# Patient Record
Sex: Male | Born: 1955 | Race: White | Hispanic: No | Marital: Married | Smoking: Former smoker
Health system: Southern US, Community
[De-identification: ages and names within clinical notes are randomized; demographics above are authoritative.]

## PROBLEM LIST (undated history)

## (undated) DIAGNOSIS — I10 Essential (primary) hypertension: Secondary | ICD-10-CM

## (undated) DIAGNOSIS — J189 Pneumonia, unspecified organism: Secondary | ICD-10-CM

## (undated) DIAGNOSIS — Z9289 Personal history of other medical treatment: Secondary | ICD-10-CM

## (undated) DIAGNOSIS — G4733 Obstructive sleep apnea (adult) (pediatric): Secondary | ICD-10-CM

## (undated) DIAGNOSIS — R7303 Prediabetes: Secondary | ICD-10-CM

## (undated) DIAGNOSIS — Z9989 Dependence on other enabling machines and devices: Secondary | ICD-10-CM

## (undated) DIAGNOSIS — I219 Acute myocardial infarction, unspecified: Secondary | ICD-10-CM

## (undated) DIAGNOSIS — J45909 Unspecified asthma, uncomplicated: Secondary | ICD-10-CM

## (undated) DIAGNOSIS — M1990 Unspecified osteoarthritis, unspecified site: Secondary | ICD-10-CM

## (undated) DIAGNOSIS — K219 Gastro-esophageal reflux disease without esophagitis: Secondary | ICD-10-CM

## (undated) HISTORY — DX: Obstructive sleep apnea (adult) (pediatric): G47.33

## (undated) HISTORY — PX: WISDOM TOOTH EXTRACTION: SHX21

## (undated) HISTORY — PX: RECONSTRUCTION URETHROPLASTY: SHX2302

## (undated) HISTORY — DX: Essential (primary) hypertension: I10

## (undated) HISTORY — PX: SHOULDER ARTHROSCOPY W/ ROTATOR CUFF REPAIR: SHX2400

## (undated) HISTORY — PX: NASAL SINUS SURGERY: SHX719

## (undated) HISTORY — PX: TONSILLECTOMY: SUR1361

## (undated) HISTORY — PX: JOINT REPLACEMENT: SHX530

---

## 1986-02-15 DIAGNOSIS — Z9289 Personal history of other medical treatment: Secondary | ICD-10-CM

## 1986-02-15 HISTORY — PX: SPLENECTOMY: SUR1306

## 1986-02-15 HISTORY — DX: Personal history of other medical treatment: Z92.89

## 1997-07-17 ENCOUNTER — Other Ambulatory Visit: Admission: RE | Admit: 1997-07-17 | Discharge: 1997-07-17 | Payer: Self-pay | Admitting: Otolaryngology

## 1998-11-18 ENCOUNTER — Ambulatory Visit (HOSPITAL_COMMUNITY): Admission: RE | Admit: 1998-11-18 | Discharge: 1998-11-18 | Payer: Self-pay | Admitting: Internal Medicine

## 1998-11-18 ENCOUNTER — Encounter: Payer: Self-pay | Admitting: Internal Medicine

## 2000-04-01 ENCOUNTER — Ambulatory Visit (HOSPITAL_BASED_OUTPATIENT_CLINIC_OR_DEPARTMENT_OTHER): Admission: RE | Admit: 2000-04-01 | Discharge: 2000-04-01 | Payer: Self-pay | Admitting: Surgery

## 2001-08-11 ENCOUNTER — Encounter: Admission: RE | Admit: 2001-08-11 | Discharge: 2001-08-11 | Payer: Self-pay | Admitting: Internal Medicine

## 2001-08-11 ENCOUNTER — Encounter: Payer: Self-pay | Admitting: Internal Medicine

## 2001-09-26 ENCOUNTER — Ambulatory Visit (HOSPITAL_COMMUNITY): Admission: RE | Admit: 2001-09-26 | Discharge: 2001-09-26 | Payer: Self-pay | Admitting: *Deleted

## 2010-04-07 ENCOUNTER — Other Ambulatory Visit: Payer: Self-pay | Admitting: Otolaryngology

## 2010-04-07 ENCOUNTER — Other Ambulatory Visit (HOSPITAL_COMMUNITY)
Admission: RE | Admit: 2010-04-07 | Discharge: 2010-04-07 | Disposition: A | Discharge status: Home / Self Care | Payer: BC Managed Care – PPO | Source: Ambulatory Visit | Attending: Otolaryngology | Admitting: Otolaryngology

## 2010-04-07 DIAGNOSIS — R220 Localized swelling, mass and lump, head: Secondary | ICD-10-CM | POA: Insufficient documentation

## 2010-04-24 ENCOUNTER — Other Ambulatory Visit (HOSPITAL_COMMUNITY)
Admission: RE | Admit: 2010-04-24 | Discharge: 2010-04-24 | Disposition: A | Discharge status: Home / Self Care | Payer: BC Managed Care – PPO | Source: Ambulatory Visit | Attending: Otolaryngology | Admitting: Otolaryngology

## 2010-04-24 DIAGNOSIS — R221 Localized swelling, mass and lump, neck: Secondary | ICD-10-CM | POA: Insufficient documentation

## 2010-04-24 DIAGNOSIS — R220 Localized swelling, mass and lump, head: Secondary | ICD-10-CM | POA: Insufficient documentation

## 2011-12-03 ENCOUNTER — Other Ambulatory Visit: Payer: Self-pay | Admitting: Family Medicine

## 2011-12-03 DIAGNOSIS — R222 Localized swelling, mass and lump, trunk: Secondary | ICD-10-CM

## 2011-12-07 ENCOUNTER — Other Ambulatory Visit: Payer: BC Managed Care – PPO

## 2011-12-13 ENCOUNTER — Ambulatory Visit
Admission: RE | Admit: 2011-12-13 | Discharge: 2011-12-13 | Disposition: A | Discharge status: Home / Self Care | Payer: BC Managed Care – PPO | Source: Ambulatory Visit | Attending: Family Medicine | Admitting: Family Medicine

## 2011-12-13 DIAGNOSIS — R222 Localized swelling, mass and lump, trunk: Secondary | ICD-10-CM

## 2011-12-13 MED ORDER — IOHEXOL 300 MG/ML  SOLN
75.0000 mL | Freq: Once | INTRAMUSCULAR | Status: AC | PRN
  Administered 2011-12-13: 75 mL via INTRAVENOUS

## 2014-12-13 ENCOUNTER — Other Ambulatory Visit: Payer: Self-pay | Admitting: Nurse Practitioner

## 2014-12-13 ENCOUNTER — Ambulatory Visit
Admission: RE | Admit: 2014-12-13 | Discharge: 2014-12-13 | Disposition: A | Discharge status: Home / Self Care | Payer: BLUE CROSS/BLUE SHIELD | Source: Ambulatory Visit | Attending: Nurse Practitioner | Admitting: Nurse Practitioner

## 2014-12-13 DIAGNOSIS — M5410 Radiculopathy, site unspecified: Secondary | ICD-10-CM

## 2014-12-13 DIAGNOSIS — M542 Cervicalgia: Secondary | ICD-10-CM

## 2015-02-16 DIAGNOSIS — I219 Acute myocardial infarction, unspecified: Secondary | ICD-10-CM | POA: Insufficient documentation

## 2015-02-16 HISTORY — DX: Acute myocardial infarction, unspecified: I21.9

## 2015-07-17 HISTORY — PX: TOTAL HIP ARTHROPLASTY: SHX124

## 2015-07-29 ENCOUNTER — Emergency Department (HOSPITAL_COMMUNITY): Payer: BLUE CROSS/BLUE SHIELD

## 2015-07-29 ENCOUNTER — Other Ambulatory Visit: Payer: Self-pay

## 2015-07-29 ENCOUNTER — Encounter (HOSPITAL_COMMUNITY): Payer: Self-pay | Admitting: Nurse Practitioner

## 2015-07-29 ENCOUNTER — Emergency Department (HOSPITAL_COMMUNITY)
Admission: EM | Admit: 2015-07-29 | Discharge: 2015-07-29 | Disposition: A | Discharge status: Home / Self Care | Payer: BLUE CROSS/BLUE SHIELD | Attending: Emergency Medicine | Admitting: Emergency Medicine

## 2015-07-29 DIAGNOSIS — Z792 Long term (current) use of antibiotics: Secondary | ICD-10-CM | POA: Insufficient documentation

## 2015-07-29 DIAGNOSIS — Z79891 Long term (current) use of opiate analgesic: Secondary | ICD-10-CM | POA: Diagnosis not present

## 2015-07-29 DIAGNOSIS — K859 Acute pancreatitis without necrosis or infection, unspecified: Secondary | ICD-10-CM | POA: Diagnosis not present

## 2015-07-29 DIAGNOSIS — R109 Unspecified abdominal pain: Secondary | ICD-10-CM | POA: Diagnosis not present

## 2015-07-29 DIAGNOSIS — R079 Chest pain, unspecified: Secondary | ICD-10-CM | POA: Diagnosis not present

## 2015-07-29 DIAGNOSIS — Z79899 Other long term (current) drug therapy: Secondary | ICD-10-CM | POA: Diagnosis not present

## 2015-07-29 DIAGNOSIS — K760 Fatty (change of) liver, not elsewhere classified: Secondary | ICD-10-CM | POA: Diagnosis not present

## 2015-07-29 DIAGNOSIS — I10 Essential (primary) hypertension: Secondary | ICD-10-CM | POA: Diagnosis not present

## 2015-07-29 DIAGNOSIS — R0789 Other chest pain: Secondary | ICD-10-CM | POA: Diagnosis not present

## 2015-07-29 HISTORY — DX: Gastro-esophageal reflux disease without esophagitis: K21.9

## 2015-07-29 HISTORY — DX: Essential (primary) hypertension: I10

## 2015-07-29 LAB — HEPATIC FUNCTION PANEL
ALK PHOS: 65 U/L (ref 38–126)
ALT: 63 U/L (ref 17–63)
AST: 56 U/L — AB (ref 15–41)
Albumin: 4.4 g/dL (ref 3.5–5.0)
Bilirubin, Direct: 0.3 mg/dL (ref 0.1–0.5)
Indirect Bilirubin: 1.1 mg/dL — ABNORMAL HIGH (ref 0.3–0.9)
TOTAL PROTEIN: 7.8 g/dL (ref 6.5–8.1)
Total Bilirubin: 1.4 mg/dL — ABNORMAL HIGH (ref 0.3–1.2)

## 2015-07-29 LAB — BASIC METABOLIC PANEL
ANION GAP: 11 (ref 5–15)
BUN: 12 mg/dL (ref 6–20)
CHLORIDE: 102 mmol/L (ref 101–111)
CO2: 24 mmol/L (ref 22–32)
Calcium: 10 mg/dL (ref 8.9–10.3)
Creatinine, Ser: 1.12 mg/dL (ref 0.61–1.24)
GFR calc Af Amer: >60 mL/min (ref >60)
GLUCOSE: 120 mg/dL — AB (ref 65–99)
POTASSIUM: 3.9 mmol/L (ref 3.5–5.1)
Sodium: 137 mmol/L (ref 135–145)

## 2015-07-29 LAB — I-STAT TROPONIN, ED: Troponin i, poc: 0 ng/mL (ref 0.00–0.08)

## 2015-07-29 LAB — LIPASE, BLOOD: Lipase: 64 U/L — ABNORMAL HIGH (ref 11–51)

## 2015-07-29 LAB — CBC
HEMATOCRIT: 50.1 % (ref 39.0–52.0)
HEMOGLOBIN: 17.4 g/dL — AB (ref 13.0–17.0)
MCH: 33.9 pg (ref 26.0–34.0)
MCHC: 34.7 g/dL (ref 30.0–36.0)
MCV: 97.5 fL (ref 78.0–100.0)
Platelets: 385 10*3/uL (ref 150–400)
RBC: 5.14 MIL/uL (ref 4.22–5.81)
RDW: 14.6 % (ref 11.5–15.5)
WBC: 8.7 10*3/uL (ref 4.0–10.5)

## 2015-07-29 MED ORDER — SODIUM CHLORIDE 0.9 % IV BOLUS (SEPSIS)
1000.0000 mL | Freq: Once | INTRAVENOUS | Status: AC
  Administered 2015-07-29: 1000 mL via INTRAVENOUS

## 2015-07-29 MED ORDER — IOPAMIDOL (ISOVUE-370) INJECTION 76%
INTRAVENOUS | Status: AC
  Administered 2015-07-29: 100 mL
  Filled 2015-07-29: qty 100

## 2015-07-29 MED ORDER — OXYCODONE-ACETAMINOPHEN 5-325 MG PO TABS: 1.0000 | ORAL_TABLET | Freq: Four times a day (QID) | ORAL | Status: DC | PRN

## 2015-07-29 MED ORDER — ONDANSETRON 8 MG PO TBDP: 8.0000 mg | ORAL_TABLET | Freq: Three times a day (TID) | ORAL | Status: DC | PRN

## 2015-07-29 MED ORDER — OXYCODONE-ACETAMINOPHEN 5-325 MG PO TABS
1.0000 | ORAL_TABLET | Freq: Once | ORAL | Status: AC
  Administered 2015-07-29: 1 via ORAL
  Filled 2015-07-29: qty 1

## 2015-07-29 MED ORDER — FENTANYL CITRATE (PF) 100 MCG/2ML IJ SOLN
100.0000 ug | Freq: Once | INTRAMUSCULAR | Status: AC
  Administered 2015-07-29: 100 ug via INTRAVENOUS
  Filled 2015-07-29: qty 2

## 2015-07-29 MED ORDER — ONDANSETRON HCL 4 MG/2ML IJ SOLN
4.0000 mg | Freq: Once | INTRAMUSCULAR | Status: AC
Start: 2015-07-29 — End: 2015-07-29
  Administered 2015-07-29: 4 mg via INTRAVENOUS
  Filled 2015-07-29: qty 2

## 2015-07-29 NOTE — ED Notes (Signed)
Patient transported to CT 

## 2015-07-29 NOTE — ED Provider Notes (Signed)
CSN: [id number]     Arrival date & time 07/29/15  1400 History   First MD Initiated Contact with Patient 07/29/15 1529     Chief Complaint  Patient presents with  . Chest Pain      Patient is a 60 y.o. male presenting with chest pain. The history is provided by the patient.  Chest Pain Pain location:  L chest Associated symptoms: abdominal pain, nausea and vomiting   Associated symptoms: no cough   Patient presented with pain in his left chest left abdomen that started a few hours ago. Has had nausea with some vomiting over the last hour. States he's felt bad. Pain is been in his chest and abdomen. Was worse with breathing. Reportedly a few weeks ago was coughing up blood. Never saw Dr. for it. Previous history of neck cancer. 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 No diarrhea. Was reportedly diaphoretic. No fevers. No chills. No swelling in his legs. 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  Past Medical History  Diagnosis Date  . Hypertension   . GERD (gastroesophageal reflux disease)    Past Surgical History  Procedure Laterality Date  . Splenectomy     No family history on file. Social History  Substance Use Topics  . Smoking status: Never Smoker   . Smokeless tobacco: None  . Alcohol Use: No    Review of Systems  Constitutional: Negative for appetite change.  HENT: Negative for sore throat.   Respiratory: Negative for cough.   Cardiovascular: Positive for chest pain.  Gastrointestinal: Positive for nausea, vomiting and abdominal pain.  Genitourinary: Negative for flank pain.  Musculoskeletal: Negative for joint swelling.  Skin: Negative for wound.  Neurological: Positive for light-headedness. Negative for speech difficulty.  Hematological: Negative for adenopathy.  Psychiatric/Behavioral: Negative for dysphoric mood.      Allergies  Codeine  Home Medications   Prior to Admission medications   Medication Sig Start Date  End Date Taking? Authorizing Provider  esomeprazole (NEXIUM) 40 MG capsule Take 40 mg by mouth daily at 12 noon.   Yes Historical Provider, MD  losartan-hydrochlorothiazide (HYZAAR) 100-12.5 MG tablet Take 1 tablet by mouth daily. for blood pressure 06/03/15  Yes Historical Provider, MD  amoxicillin-clavulanate (AUGMENTIN) 500-125 MG tablet Take 1 tablet by mouth 2 (two) times daily. 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    Historical Provider, MD  ondansetron (ZOFRAN-ODT) 8 MG disintegrating tablet Take 1 tablet (8 mg total) by mouth every 8 (eight) hours as needed for nausea or vomiting. 07/29/15   Davonna Belling, MD  oxyCODONE-acetaminophen (PERCOCET/ROXICET) 5-325 MG tablet Take 1-2 tablets by mouth every 6 (six) hours as needed for severe pain. 07/29/15   Davonna Belling, MD   BP 160/97 mmHg  Pulse 76  Temp(Src) 98.1 F (36.7 C) (Oral)  Resp 21  Ht 6' (1.829 m)  Wt 224 lb (101.606 kg)  BMI 30.37 kg/m2  SpO2 94% Physical Exam  Constitutional: He appears well-developed.  Patient appears uncomfortable  HENT:  Head: Atraumatic.  Eyes: EOM are normal.  Neck: Neck supple.  Cardiovascular: Normal rate.   Pulmonary/Chest: Effort normal. No respiratory distress. He has no wheezes. He has no rales.  Abdominal: There is tenderness.  Moderate tenderness to left upper quadrant.  Musculoskeletal: Normal range of motion. He exhibits no edema.  Neurological: He is alert.  Skin: Skin is warm.  ED Course  Procedures (including critical care time) Labs Review Labs Reviewed  BASIC METABOLIC PANEL - Abnormal; Notable for the following:    Glucose, Bld 120 (*)    All other components within normal limits  CBC - Abnormal; Notable for the following:    Hemoglobin 17.4 (*)    All other components within normal limits  HEPATIC FUNCTION PANEL - Abnormal; Notable for the following:    AST 56 (*)    Total Bilirubin 1.4 (*)     Indirect Bilirubin 1.1 (*)    All other components within normal limits  LIPASE, BLOOD - Abnormal; Notable for the following:    Lipase 64 (*)    All other components within normal limits  I-STAT TROPOININ, ED    Imaging Review Dg Chest 2 View  07/29/2015  CLINICAL DATA:  Chest pain for 1 day EXAM: CHEST  2 VIEW COMPARISON:  Chest CT December 13, 2011 FINDINGS: There is patchy infiltrate in the left base. There is slight scarring in the right base. Lungs elsewhere clear. Heart size and pulmonary vascularity are normal. No adenopathy. No bone lesions. IMPRESSION: Patchy infiltrate left base consistent with pneumonia. Mild scarring right base. Lungs elsewhere clear. Cardiac silhouette within normal limits. Followup PA and lateral chest radiographs recommended in 3-4 weeks following trial of antibiotic therapy to ensure resolution and exclude underlying malignancy. Electronically Signed   By: Lowella Grip III M.D.   On: 07/29/2015 14:37   Ct Angio Chest Pe W/cm &/or Wo Cm  07/29/2015  CLINICAL DATA:  Left-sided chest pain. Left upper quadrant tenderness. EXAM: CT ANGIOGRAPHY CHEST CT ABDOMEN AND PELVIS WITH CONTRAST TECHNIQUE: Multidetector CT imaging of the chest was performed using the standard protocol during bolus administration of intravenous contrast. Multiplanar CT image reconstructions and MIPs were obtained to evaluate the vascular anatomy. Multidetector CT imaging of the abdomen and pelvis was performed using the standard protocol during bolus administration of intravenous contrast. CONTRAST:  100 mL Isovue 370 COMPARISON:  Chest radiograph 07/29/2015 FINDINGS: CTA CHEST FINDINGS Mediastinum/Lymph nodes: Chest CTA has some limitation due to motion artifact. There is no evidence for large or central pulmonary embolism. Limited evaluation of the aortic root due to extensive artifact. The great vessels are patent with a bovine type arch. There is no significant chest lymphadenopathy. No large  pericardial effusion. No significant axillary lymphadenopathy. Lungs/Pleura: Trachea and mainstem bronchi are patent. Patchy densities at the left lung base and the lingula are nonspecific but could represent atelectasis. Patchy densities along the right lower lobe are also suggestive for volume loss. Musculoskeletal: No acute bone abnormality. CT ABDOMEN and PELVIS FINDINGS Hepatobiliary: Low-attenuation of the liver is suggestive for hepatic steatosis. No focal liver lesion. No gross abnormality to the gallbladder. The portal venous system is patent. Pancreas: There is edema and fat stranding in the region of the pancreatic neck and body. There is no significant pancreatic duct dilatation. 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 There are at least 2 small splenules in the left upper quadrant of the abdomen. Adrenals/Urinary tract: Normal appearance of the adrenal glands. Cyst in the right kidney interpolar region. Probable small cyst in the medial aspect of left kidney. No hydronephrosis. Normal appearance of the urinary bladder. Stomach/Bowel: No acute abnormality to the stomach or duodenum. Scattered colonic diverticula but acute colonic inflammatory changes. Normal appearance of the appendix. Vascular/Lymphatic: Negative for an aortic aneurysm. No suspicious lymphadenopathy in the abdomen or pelvis. Reproductive: Mild fat stranding in the pelvis near the seminal  vesicles is nonspecific. No gross abnormality to the prostate. Other: No significant free fluid.  No free air. Musculoskeletal: No acute bone abnormality. Mild disc space loss at L5-S1. Review of the MIP images confirms the above findings. IMPRESSION: CTA examination of the chest has technical limitations due to motion artifact. There is no evidence for a large or central pulmonary embolism. Patchy densities at the left lung base are most suggestive for atelectasis. Peripancreatic edema and fat stranding around the pancreatic neck and body. Findings are  suggestive for pancreatitis. No evidence for duct dilatation and no evidence for pseudocyst formation. Hepatic steatosis. Bilateral renal cysts. Colonic diverticula without acute inflammation. Mild stranding or edema near the seminal vesicles is nonspecific. Electronically Signed   By: Markus Daft M.D.   On: 07/29/2015 17:06   Ct Abdomen Pelvis W Contrast  07/29/2015  CLINICAL DATA:  Left-sided chest pain. Left upper quadrant tenderness. EXAM: CT ANGIOGRAPHY CHEST CT ABDOMEN AND PELVIS WITH CONTRAST TECHNIQUE: Multidetector CT imaging of the chest was performed using the standard protocol during bolus administration of intravenous contrast. Multiplanar CT image reconstructions and MIPs were obtained to evaluate the vascular anatomy. Multidetector CT imaging of the abdomen and pelvis was performed using the standard protocol during bolus administration of intravenous contrast. CONTRAST:  100 mL Isovue 370 COMPARISON:  Chest radiograph 07/29/2015 FINDINGS: CTA CHEST FINDINGS Mediastinum/Lymph nodes: Chest CTA has some limitation due to motion artifact. There is no evidence for large or central pulmonary embolism. Limited evaluation of the aortic root due to extensive artifact. The great vessels are patent with a bovine type arch. There is no significant chest lymphadenopathy. No large pericardial effusion. No significant axillary lymphadenopathy. Lungs/Pleura: Trachea and mainstem bronchi are patent. Patchy densities at the left lung base and the lingula are nonspecific but could represent atelectasis. Patchy densities along the right lower lobe are also suggestive for volume loss. Musculoskeletal: No acute bone abnormality. CT ABDOMEN and PELVIS FINDINGS Hepatobiliary: Low-attenuation of the liver is suggestive for hepatic steatosis. No focal liver lesion. No gross abnormality to the gallbladder. The portal venous system is patent. Pancreas: There is edema and fat stranding in the region of the pancreatic neck and  body. There is no significant pancreatic duct dilatation. 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 There are at least 2 small splenules in the left upper quadrant of the abdomen. Adrenals/Urinary tract: Normal appearance of the adrenal glands. Cyst in the right kidney interpolar region. Probable small cyst in the medial aspect of left kidney. No hydronephrosis. Normal appearance of the urinary bladder. Stomach/Bowel: No acute abnormality to the stomach or duodenum. Scattered colonic diverticula but acute colonic inflammatory changes. Normal appearance of the appendix. Vascular/Lymphatic: Negative for an aortic aneurysm. No suspicious lymphadenopathy in the abdomen or pelvis. Reproductive: Mild fat stranding in the pelvis near the seminal vesicles is nonspecific. No gross abnormality to the prostate. Other: No significant free fluid.  No free air. Musculoskeletal: No acute bone abnormality. Mild disc space loss at L5-S1. Review of the MIP images confirms the above findings. IMPRESSION: CTA examination of the chest has technical limitations due to motion artifact. There is no evidence for a large or central pulmonary embolism. Patchy densities at the left lung base are most suggestive for atelectasis. Peripancreatic edema and fat stranding around the pancreatic neck and body. Findings are suggestive for pancreatitis. No evidence for duct dilatation and no evidence for pseudocyst formation. Hepatic steatosis. Bilateral renal cysts. Colonic diverticula without acute inflammation. Mild stranding or edema near the  seminal vesicles is nonspecific. Electronically Signed   By: Markus Daft M.D.   On: 07/29/2015 17:06   I have personally reviewed and evaluated these images and lab results as part of my medical decision-making.   EKG Interpretation   Date/Time:  Tuesday July 29 2015 14:02:59 EDT Ventricular Rate:  80 PR Interval:  174 QRS Duration: 94 QT Interval:  400 QTC Calculation: 461 R Axis:   57 Text Interpretation:   Normal sinus rhythm Inferior infarct , age  undetermined Cannot rule out Anterior infarct , age undetermined Abnormal  ECG Confirmed by Alvino Chapel  MD, Ovid Curd [phone]) on 07/29/2015 3:31:16 PM      MDM   Final diagnoses:  Acute pancreatitis, unspecified complication status, unspecified pancreatitis type    Patient with abdominal pain and chest pain. Pain was somewhat pleuritic but was tender in abdomen. [facility] chest did not show blood clot however the does show pancreatitis. Upon further questioning he states he did somewhat recently drink alcohol more heavily. He does have a couple glasses of wine a day. He is tolerating orals since feels as if his pain is controlled. Will discharge home to follow-up with PCP and GI as needed.    Davonna Belling, MD 07/29/15 [phone]

## 2015-07-29 NOTE — ED Notes (Signed)
Pt endorses central left sided chest pain that started at work 3-4 hours ago. Patient took tums without relief. Patient pain had increased throughout the day. Patient presenting diaphoretic clutching chest. Sts pain is 10/10 with sharp shooting pains. Patient has HTN and no other cardiac history. Patient alert and oriented x4 able to speak full sentences.

## 2015-07-29 NOTE — ED Notes (Signed)
Family at bedside. 

## 2015-07-29 NOTE — ED Notes (Signed)
Pt returned to xray sts pt was diaphoretic and holding chest. Repeat EKG completed and old and new EKG shown to Dr. Oleta Mouse. Pt and family member appearing anxious informed of EKG results and seem more calm. Pt in view of nurse first desk.

## 2015-07-29 NOTE — Discharge Instructions (Signed)

## 2015-08-06 DIAGNOSIS — Z6832 Body mass index (BMI) 32.0-32.9, adult: Secondary | ICD-10-CM | POA: Diagnosis not present

## 2015-08-06 DIAGNOSIS — Z9081 Acquired absence of spleen: Secondary | ICD-10-CM | POA: Diagnosis not present

## 2015-08-06 DIAGNOSIS — K859 Acute pancreatitis without necrosis or infection, unspecified: Secondary | ICD-10-CM | POA: Diagnosis not present

## 2015-08-06 DIAGNOSIS — Z79899 Other long term (current) drug therapy: Secondary | ICD-10-CM | POA: Diagnosis not present

## 2015-08-07 ENCOUNTER — Encounter: Payer: Self-pay | Admitting: Internal Medicine

## 2015-08-12 ENCOUNTER — Telehealth: Payer: Self-pay | Admitting: Internal Medicine

## 2015-08-13 NOTE — Telephone Encounter (Signed)
Pt having abdominal pain and nausea, has been seen in the ER for ?pancreatitis. PCP requests pt be seen sooner than scheduled appt, request asap. Pt scheduled to see Alonza Bogus PA 08/22/15@11am . Pt aware of appt.

## 2015-08-13 NOTE — Telephone Encounter (Signed)
Left message to call back  

## 2015-08-22 ENCOUNTER — Other Ambulatory Visit (INDEPENDENT_AMBULATORY_CARE_PROVIDER_SITE_OTHER): Payer: BLUE CROSS/BLUE SHIELD

## 2015-08-22 ENCOUNTER — Encounter: Payer: Self-pay | Admitting: Gastroenterology

## 2015-08-22 ENCOUNTER — Ambulatory Visit (INDEPENDENT_AMBULATORY_CARE_PROVIDER_SITE_OTHER): Payer: BLUE CROSS/BLUE SHIELD | Admitting: Gastroenterology

## 2015-08-22 VITALS — BP 140/78 | HR 76 | Ht 72.0 in | Wt 226.4 lb

## 2015-08-22 DIAGNOSIS — R1011 Right upper quadrant pain: Secondary | ICD-10-CM

## 2015-08-22 DIAGNOSIS — K859 Acute pancreatitis without necrosis or infection, unspecified: Secondary | ICD-10-CM

## 2015-08-22 HISTORY — DX: Right upper quadrant pain: R10.11

## 2015-08-22 HISTORY — DX: Acute pancreatitis without necrosis or infection, unspecified: K85.90

## 2015-08-22 LAB — HEPATIC FUNCTION PANEL
ALBUMIN: 4.5 g/dL (ref 3.5–5.2)
ALT: 30 U/L (ref 0–53)
AST: 26 U/L (ref 0–37)
Alkaline Phosphatase: 70 U/L (ref 39–117)
Bilirubin, Direct: 0.1 mg/dL (ref 0.0–0.3)
Total Bilirubin: 0.7 mg/dL (ref 0.2–1.2)
Total Protein: 7.5 g/dL (ref 6.0–8.3)

## 2015-08-22 LAB — LIPASE: Lipase: 52 U/L (ref 11.0–59.0)

## 2015-08-22 NOTE — Progress Notes (Addendum)
08/22/2015 Jose Clay [id number] [birth year]   HISTORY OF PRESENT ILLNESS:  This is a pleasant 60 year old male who is new to our office and was referred here by Cyndi Bender, PA-C. Patient was recently evaluated in the ER for epigastric/right upper quadrant abdominal pain. He was diagnosed with acute pancreatitis after being found to have a lipase of 64 and a CT scan of the abdomen and pelvis with contrast showing peripancreatic edema and fat stranding surrounding the pancreatic neck and body suggestive of pancreatitis. There was no evidence for ductal dilatation and no evidence for pseudocyst formation.  Gallbladder appeared normal. ALT and alkaline phosphatase were normal, but total bilirubin was slightly elevated at 1.4 and AST slightly elevated at 56. He presents here today for follow-up of this. He states that he is feeling much better, about 100%.  He does admit to daily alcohol use in the form of mostly wine or beer, but not in large amounts at a time. He also reports that in the past he had a history of a "spastic gallbladder" and they never took it out.    He did have a colonoscopy in 2014 by Dr. Melina Copa in [place] that was reportedly normal. We are going to obtain the records on that. He reports family history and his father and an uncle.   Past Medical History  Diagnosis Date  . Hypertension   . GERD (gastroesophageal reflux disease)    Past Surgical History  Procedure Laterality Date  . Splenectomy      reports that he has never smoked. He has never used smokeless tobacco. He reports that he does not drink alcohol or use illicit drugs. family history includes Colon cancer in his father; Diabetes in his mother. Allergies  Allergen Reactions  . Codeine Hives      Outpatient Encounter Prescriptions as of 08/22/2015  Medication Sig  . amoxicillin-clavulanate (AUGMENTIN) 500-125 MG tablet Take 1 tablet by mouth 2 (two) times daily. 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  . esomeprazole (NEXIUM) 40 MG capsule Take 40 mg by mouth daily at 12 noon.  Marland Kitchen losartan-hydrochlorothiazide (HYZAAR) 100-12.5 MG tablet Take 1 tablet by mouth daily. for blood pressure  . ondansetron (ZOFRAN-ODT) 8 MG disintegrating tablet Take 1 tablet (8 mg total) by mouth every 8 (eight) hours as needed for nausea or vomiting.  Marland Kitchen oxyCODONE-acetaminophen (PERCOCET/ROXICET) 5-325 MG tablet Take 1-2 tablets by mouth every 6 (six) hours as needed for severe pain.   No facility-administered encounter medications on file as of 08/22/2015.     REVIEW OF SYSTEMS  : All other systems reviewed and negative except where noted in the History of Present Illness.   PHYSICAL EXAM: BP 140/78 mmHg  Pulse 76  Ht 6' (1.829 m)  Wt 226 lb 6.4 oz (102.694 kg)  BMI 30.70 kg/m2  SpO2 98% General: Well developed white male in no acute distress Head: Normocephalic and atraumatic Eyes:  Sclerae anicteric, conjunctiva pink. Ears: Normal auditory acuity Lungs: Clear throughout to auscultation Heart: Regular rate and rhythm Abdomen: Soft, non-distended.  Normal bowel sounds.  Non-tender. Musculoskeletal: Symmetrical with no gross deformities  Skin: No lesions on visible extremities Extremities: No edema  Neurological: Alert oriented x 4, grossly non-focal Psychological:  Alert and cooperative. Normal mood and affect  ASSESSMENT AND PLAN: -Acute pancreatitis:  Significantly improved/resolved.  Likely alcohol-induced since he does drink some alcohol daily, but AST and total  bili both slightly elevated during this incident as well. CT scan did not indicate any gallbladder/gallstone issues, but we will schedule ultrasound for better/further evaluation. Will repeat lipase and LFTs today. Advised him to try to significantly reduce his alcohol use to prevent future recurrence.  *Will obtain previous colonoscopy records from Dr. Melina Copa in [place].   Will need a five-year recall due to family history of colon cancer.  **Colonoscopy report obtained. He had a colonoscopy in April 2014 at which time he had a single sessile 3 mm polyp found at the hepatic flexure and a single sessile 3 mm polyp found in the transverse colon. Also had multiple diverticula seen in the whole colon. Polyps were tubular adenomas.  Will enter for a 5 year colonoscopy recall.  CC:  Cyndi Bender, PA-C

## 2015-08-22 NOTE — Patient Instructions (Signed)
Please go to the basement level to have your labs drawn.  You have been scheduled for an abdominal ultrasound at [facility] (1st floor of hospital) on Tuesday 08-26-2015 at 9:30 am. Please arrive at 9:15 r to your appointment for registration. Make certain not to have anything to eat or drink after midnight. Should you need to reschedule your appointment, please contact radiology at [phone]. This test typically takes about 30 minutes to perform.

## 2015-08-22 NOTE — Progress Notes (Signed)
i agree with the above note, plan 

## 2015-08-26 ENCOUNTER — Ambulatory Visit (HOSPITAL_COMMUNITY): Payer: BLUE CROSS/BLUE SHIELD

## 2015-08-27 ENCOUNTER — Ambulatory Visit (HOSPITAL_COMMUNITY)
Admission: RE | Admit: 2015-08-27 | Discharge: 2015-08-27 | Disposition: A | Discharge status: Home / Self Care | Payer: BLUE CROSS/BLUE SHIELD | Source: Ambulatory Visit | Attending: Gastroenterology | Admitting: Gastroenterology

## 2015-08-27 DIAGNOSIS — R1011 Right upper quadrant pain: Secondary | ICD-10-CM | POA: Insufficient documentation

## 2015-08-27 DIAGNOSIS — Z9081 Acquired absence of spleen: Secondary | ICD-10-CM | POA: Diagnosis not present

## 2015-08-27 DIAGNOSIS — K859 Acute pancreatitis without necrosis or infection, unspecified: Secondary | ICD-10-CM | POA: Diagnosis not present

## 2015-08-27 DIAGNOSIS — K760 Fatty (change of) liver, not elsewhere classified: Secondary | ICD-10-CM | POA: Diagnosis not present

## 2015-08-27 DIAGNOSIS — I700 Atherosclerosis of aorta: Secondary | ICD-10-CM | POA: Diagnosis not present

## 2015-10-03 DIAGNOSIS — L237 Allergic contact dermatitis due to plants, except food: Secondary | ICD-10-CM | POA: Diagnosis not present

## 2015-10-03 DIAGNOSIS — Z6833 Body mass index (BMI) 33.0-33.9, adult: Secondary | ICD-10-CM | POA: Diagnosis not present

## 2015-10-13 ENCOUNTER — Ambulatory Visit: Payer: BLUE CROSS/BLUE SHIELD | Admitting: Internal Medicine

## 2015-11-26 DIAGNOSIS — R7303 Prediabetes: Secondary | ICD-10-CM | POA: Diagnosis not present

## 2015-11-26 DIAGNOSIS — I10 Essential (primary) hypertension: Secondary | ICD-10-CM | POA: Diagnosis not present

## 2015-11-26 DIAGNOSIS — K859 Acute pancreatitis without necrosis or infection, unspecified: Secondary | ICD-10-CM | POA: Diagnosis not present

## 2015-11-26 DIAGNOSIS — Z23 Encounter for immunization: Secondary | ICD-10-CM | POA: Diagnosis not present

## 2015-11-26 DIAGNOSIS — E7800 Pure hypercholesterolemia, unspecified: Secondary | ICD-10-CM | POA: Diagnosis not present

## 2015-11-26 DIAGNOSIS — Z9081 Acquired absence of spleen: Secondary | ICD-10-CM | POA: Diagnosis not present

## 2015-11-27 DIAGNOSIS — M5432 Sciatica, left side: Secondary | ICD-10-CM | POA: Diagnosis not present

## 2015-12-04 ENCOUNTER — Other Ambulatory Visit: Payer: Self-pay | Admitting: Physician Assistant

## 2015-12-04 ENCOUNTER — Ambulatory Visit
Admission: RE | Admit: 2015-12-04 | Discharge: 2015-12-04 | Disposition: A | Discharge status: Home / Self Care | Payer: BLUE CROSS/BLUE SHIELD | Source: Ambulatory Visit | Attending: Physician Assistant | Admitting: Physician Assistant

## 2015-12-04 DIAGNOSIS — M25552 Pain in left hip: Secondary | ICD-10-CM | POA: Diagnosis not present

## 2015-12-04 DIAGNOSIS — M545 Low back pain: Secondary | ICD-10-CM | POA: Diagnosis not present

## 2015-12-04 DIAGNOSIS — M5432 Sciatica, left side: Secondary | ICD-10-CM

## 2015-12-10 DIAGNOSIS — M5117 Intervertebral disc disorders with radiculopathy, lumbosacral region: Secondary | ICD-10-CM | POA: Diagnosis not present

## 2015-12-10 DIAGNOSIS — M5116 Intervertebral disc disorders with radiculopathy, lumbar region: Secondary | ICD-10-CM | POA: Diagnosis not present

## 2015-12-10 DIAGNOSIS — M4726 Other spondylosis with radiculopathy, lumbar region: Secondary | ICD-10-CM | POA: Diagnosis not present

## 2015-12-17 DIAGNOSIS — Z6830 Body mass index (BMI) 30.0-30.9, adult: Secondary | ICD-10-CM | POA: Diagnosis not present

## 2015-12-17 DIAGNOSIS — M5126 Other intervertebral disc displacement, lumbar region: Secondary | ICD-10-CM | POA: Diagnosis not present

## 2015-12-17 DIAGNOSIS — I10 Essential (primary) hypertension: Secondary | ICD-10-CM | POA: Diagnosis not present

## 2015-12-22 ENCOUNTER — Other Ambulatory Visit: Payer: Self-pay | Admitting: Neurosurgery

## 2015-12-22 DIAGNOSIS — M5126 Other intervertebral disc displacement, lumbar region: Secondary | ICD-10-CM

## 2015-12-26 ENCOUNTER — Ambulatory Visit
Admission: RE | Admit: 2015-12-26 | Discharge: 2015-12-26 | Disposition: A | Discharge status: Home / Self Care | Payer: BLUE CROSS/BLUE SHIELD | Source: Ambulatory Visit | Attending: Neurosurgery | Admitting: Neurosurgery

## 2015-12-26 DIAGNOSIS — M5126 Other intervertebral disc displacement, lumbar region: Secondary | ICD-10-CM | POA: Diagnosis not present

## 2015-12-26 MED ORDER — IOPAMIDOL (ISOVUE-M 200) INJECTION 41%
1.0000 mL | Freq: Once | INTRAMUSCULAR | Status: AC
  Administered 2015-12-26: 1 mL via EPIDURAL

## 2015-12-26 MED ORDER — METHYLPREDNISOLONE ACETATE 40 MG/ML INJ SUSP (RADIOLOG
120.0000 mg | Freq: Once | INTRAMUSCULAR | Status: AC
  Administered 2015-12-26: 120 mg via EPIDURAL

## 2015-12-26 NOTE — Discharge Instructions (Signed)

## 2016-02-04 DIAGNOSIS — I10 Essential (primary) hypertension: Secondary | ICD-10-CM | POA: Diagnosis not present

## 2016-02-04 DIAGNOSIS — Z6831 Body mass index (BMI) 31.0-31.9, adult: Secondary | ICD-10-CM | POA: Diagnosis not present

## 2016-02-04 DIAGNOSIS — M5126 Other intervertebral disc displacement, lumbar region: Secondary | ICD-10-CM | POA: Diagnosis not present

## 2016-02-19 DIAGNOSIS — R1313 Dysphagia, pharyngeal phase: Secondary | ICD-10-CM | POA: Diagnosis not present

## 2016-02-19 DIAGNOSIS — J310 Chronic rhinitis: Secondary | ICD-10-CM | POA: Diagnosis not present

## 2016-03-03 DIAGNOSIS — M5416 Radiculopathy, lumbar region: Secondary | ICD-10-CM | POA: Diagnosis not present

## 2016-03-03 DIAGNOSIS — Z79899 Other long term (current) drug therapy: Secondary | ICD-10-CM | POA: Diagnosis not present

## 2016-03-03 DIAGNOSIS — Z9081 Acquired absence of spleen: Secondary | ICD-10-CM | POA: Diagnosis not present

## 2016-03-03 DIAGNOSIS — Z6834 Body mass index (BMI) 34.0-34.9, adult: Secondary | ICD-10-CM | POA: Diagnosis not present

## 2016-03-25 DIAGNOSIS — R062 Wheezing: Secondary | ICD-10-CM | POA: Diagnosis not present

## 2016-03-25 DIAGNOSIS — M5416 Radiculopathy, lumbar region: Secondary | ICD-10-CM | POA: Diagnosis not present

## 2016-03-25 DIAGNOSIS — R05 Cough: Secondary | ICD-10-CM | POA: Diagnosis not present

## 2016-03-25 DIAGNOSIS — M79606 Pain in leg, unspecified: Secondary | ICD-10-CM | POA: Diagnosis not present

## 2016-03-26 DIAGNOSIS — M79606 Pain in leg, unspecified: Secondary | ICD-10-CM | POA: Diagnosis not present

## 2016-03-26 DIAGNOSIS — R600 Localized edema: Secondary | ICD-10-CM | POA: Diagnosis not present

## 2016-04-01 DIAGNOSIS — M5416 Radiculopathy, lumbar region: Secondary | ICD-10-CM | POA: Diagnosis not present

## 2016-04-01 DIAGNOSIS — Z1389 Encounter for screening for other disorder: Secondary | ICD-10-CM | POA: Diagnosis not present

## 2016-04-01 DIAGNOSIS — R062 Wheezing: Secondary | ICD-10-CM | POA: Diagnosis not present

## 2016-04-01 DIAGNOSIS — Z9081 Acquired absence of spleen: Secondary | ICD-10-CM | POA: Diagnosis not present

## 2016-04-01 DIAGNOSIS — Z23 Encounter for immunization: Secondary | ICD-10-CM | POA: Diagnosis not present

## 2016-04-01 DIAGNOSIS — R05 Cough: Secondary | ICD-10-CM | POA: Diagnosis not present

## 2016-05-04 DIAGNOSIS — H2513 Age-related nuclear cataract, bilateral: Secondary | ICD-10-CM | POA: Diagnosis not present

## 2016-05-06 DIAGNOSIS — R1031 Right lower quadrant pain: Secondary | ICD-10-CM | POA: Diagnosis not present

## 2016-05-06 DIAGNOSIS — Z6835 Body mass index (BMI) 35.0-35.9, adult: Secondary | ICD-10-CM | POA: Diagnosis not present

## 2016-05-06 DIAGNOSIS — M25551 Pain in right hip: Secondary | ICD-10-CM | POA: Diagnosis not present

## 2016-05-09 DIAGNOSIS — M25551 Pain in right hip: Secondary | ICD-10-CM | POA: Diagnosis not present

## 2016-05-26 DIAGNOSIS — J45909 Unspecified asthma, uncomplicated: Secondary | ICD-10-CM | POA: Diagnosis not present

## 2016-05-26 DIAGNOSIS — M25551 Pain in right hip: Secondary | ICD-10-CM | POA: Diagnosis not present

## 2016-05-26 DIAGNOSIS — R05 Cough: Secondary | ICD-10-CM | POA: Diagnosis not present

## 2016-05-26 DIAGNOSIS — S20219A Contusion of unspecified front wall of thorax, initial encounter: Secondary | ICD-10-CM | POA: Diagnosis not present

## 2016-05-27 ENCOUNTER — Emergency Department (HOSPITAL_COMMUNITY)
Admission: EM | Admit: 2016-05-27 | Discharge: 2016-05-27 | Disposition: A | Discharge status: Home / Self Care | Payer: BLUE CROSS/BLUE SHIELD | Attending: Emergency Medicine | Admitting: Emergency Medicine

## 2016-05-27 ENCOUNTER — Encounter (HOSPITAL_COMMUNITY): Payer: Self-pay | Admitting: Emergency Medicine

## 2016-05-27 DIAGNOSIS — R0602 Shortness of breath: Secondary | ICD-10-CM | POA: Diagnosis not present

## 2016-05-27 DIAGNOSIS — R000 Tachycardia, unspecified: Secondary | ICD-10-CM | POA: Diagnosis not present

## 2016-05-27 DIAGNOSIS — I10 Essential (primary) hypertension: Secondary | ICD-10-CM | POA: Diagnosis not present

## 2016-05-27 DIAGNOSIS — J45909 Unspecified asthma, uncomplicated: Secondary | ICD-10-CM | POA: Insufficient documentation

## 2016-05-27 DIAGNOSIS — R220 Localized swelling, mass and lump, head: Secondary | ICD-10-CM | POA: Diagnosis not present

## 2016-05-27 DIAGNOSIS — T63441A Toxic effect of venom of bees, accidental (unintentional), initial encounter: Secondary | ICD-10-CM | POA: Diagnosis not present

## 2016-05-27 MED ORDER — DEXAMETHASONE 2 MG PO TABS: ORAL_TABLET | ORAL | 0 refills | Status: DC

## 2016-05-27 MED ORDER — EPINEPHRINE 0.3 MG/0.3ML IJ SOAJ: INTRAMUSCULAR | 0 refills | Status: DC

## 2016-05-27 MED ORDER — DEXAMETHASONE SODIUM PHOSPHATE 10 MG/ML IJ SOLN
10.0000 mg | Freq: Once | INTRAMUSCULAR | Status: AC
  Administered 2016-05-27: 10 mg via INTRAVENOUS
  Filled 2016-05-27: qty 1

## 2016-05-27 MED ORDER — FAMOTIDINE IN NACL 20-0.9 MG/50ML-% IV SOLN
20.0000 mg | Freq: Once | INTRAVENOUS | Status: AC
  Administered 2016-05-27: 20 mg via INTRAVENOUS
  Filled 2016-05-27: qty 50

## 2016-05-27 MED ORDER — DIPHENHYDRAMINE HCL 25 MG PO CAPS: ORAL_CAPSULE | ORAL | Status: DC

## 2016-05-27 NOTE — ED Triage Notes (Addendum)
Patient BIB [facility] for allergic reaction to multiple bee stings. Pt stung by about 8 bees around 1830, went to bed around 2300 after taking his albuterol treatment, then woke up at 0130 with facial swelling and itching. Pt called EMS, no shortness of breath initially, but SOB slowly progressed after EMS arrived. Pt was given 50mg  benadryl in rout  as well as 0.3 of epi after pt arrived in room.

## 2016-05-27 NOTE — ED Notes (Signed)
Bed: FF63 Expected date:  Expected time:  Means of arrival:  Comments: 61 yo M/ allergic reaction

## 2016-05-27 NOTE — ED Provider Notes (Signed)
[place] DEPT Provider Note: Georgena Spurling, MD, FACEP  CSN: [id number] MRN: [id number] ARRIVAL: 05/27/16 at [place]: Jose Clay is a 61 y.o. male with a PMHx of asthma, GERD, and HTN. Pt presents to the Emergency Department c/o an allergic reaction. Pt was stung by several bees (8) around 6:30-7 pm yesterday evening. He has asthma and gave himself an albuterol treatment about 11:30 PM yesterday prior to going to bed. He subsequently had the gradual onset of shortness of breath, and itching notably of his face and feet, and facial swelling around the eyes. Symptoms are moderate. He was given 50 milligrams of Benadryl IV by EMS prior to arrival without significant change; he was not given epinephrine. He notes never having a similar reaction to a bee sting. Pt has never used epinephrine before. Pt denies nausea, vomiting, diarrhea and any pain at this time.  Past Medical History:  Diagnosis Date  . GERD (gastroesophageal reflux disease)   . Hypertension     Past Surgical History:  Procedure Laterality Date  . SPLENECTOMY      Family History  Problem Relation Age of Onset  . Colon cancer Father   . Diabetes Mother     Social History  Substance Use Topics  . Smoking status: Never Smoker  . Smokeless tobacco: Never Used  . Alcohol use No    Prior to Admission medications   Medication Sig Start Date End Date Taking? Authorizing Provider  amoxicillin-clavulanate (AUGMENTIN) 500-125 MG tablet Take 1 tablet by mouth 2 (two) times daily. 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    Historical Provider, MD  esomeprazole (NEXIUM) 40 MG capsule Take 40 mg by mouth daily at 12 noon.    Historical Provider, MD  losartan-hydrochlorothiazide (HYZAAR) 100-12.5 MG tablet Take 1 tablet by mouth daily. for blood pressure 06/03/15   Historical  Provider, MD  ondansetron (ZOFRAN-ODT) 8 MG disintegrating tablet Take 1 tablet (8 mg total) by mouth every 8 (eight) hours as needed for nausea or vomiting. 07/29/15   Davonna Belling, MD  oxyCODONE-acetaminophen (PERCOCET/ROXICET) 5-325 MG tablet Take 1-2 tablets by mouth every 6 (six) hours as needed for severe pain. 07/29/15   Davonna Belling, MD    Allergies Bee venom and Codeine   REVIEW OF SYSTEMS  Negative except as noted here or in the History of Present Illness.   PHYSICAL EXAMINATION  Initial Vital Signs Blood pressure (!) 142/83, pulse 89, temperature 98.6 F (37 C), temperature source Oral, resp. rate 18, SpO2 90 %.   Examination General: Well-developed, well-nourished male in no acute distress; appearance consistent with age of record HENT: atraumatic; no pharyngeal edema; bilateral periorbital edema   Eyes: pupils equal, round and reactive to light; extraocular muscles intact Neck: supple Heart: regular rate and rhythm Lungs: faint expiratory wheezes Abdomen: soft; nondistended; nontender; no masses or hepatosplenomegaly; bowel sounds present Extremities: No deformity; full range of motion; pulses normal Neurologic: Awake, alert and oriented; motor function intact in all extremities and symmetric; no facial droop Skin: Warm and dry Psychiatric: Normal mood and affect   RESULTS  Summary of this visit's results, reviewed by myself:   EKG Interpretation  Date/Time:    Ventricular Rate:    PR Interval:    QRS Duration:   QT Interval:    QTC Calculation:   R Axis:  Text Interpretation:        Laboratory Studies: No results found for this or any previous visit (from the past 24 hour(s)). Imaging Studies: No results found.  ED COURSE  Nursing notes and initial vitals signs, including pulse oximetry, reviewed.  Vitals:   05/27/16 0306 05/27/16 0328 05/27/16 0405 05/27/16 0439  BP: (!) 161/107 (!) 149/99 (!) 159/99 (!) 142/83  Pulse: 95 95 94 89    Resp: 13 17 12 18   Temp:  98.6 F (37 C)    TempSrc:  Oral    SpO2: 92% 91% 94% 90%   4:45 AM Lungs clear, dyspnea and itching resolved. Facial edema improved but still present. We will have the patient take Benadryl regularly for the next several days and provide him with an EpiPen.  PROCEDURES    ED DIAGNOSES     ICD-9-CM ICD-10-CM   1. Allergic reaction to bee sting 989.5 T63.441A    E905.3         Shanon Rosser, MD 05/27/16 [phone]

## 2016-06-07 DIAGNOSIS — M1611 Unilateral primary osteoarthritis, right hip: Secondary | ICD-10-CM | POA: Diagnosis not present

## 2016-06-07 DIAGNOSIS — M25551 Pain in right hip: Secondary | ICD-10-CM | POA: Diagnosis not present

## 2016-06-15 DIAGNOSIS — M25551 Pain in right hip: Secondary | ICD-10-CM | POA: Diagnosis not present

## 2016-06-25 DIAGNOSIS — M879 Osteonecrosis, unspecified: Secondary | ICD-10-CM | POA: Diagnosis not present

## 2016-06-25 DIAGNOSIS — M25551 Pain in right hip: Secondary | ICD-10-CM | POA: Diagnosis not present

## 2016-06-25 DIAGNOSIS — M87051 Idiopathic aseptic necrosis of right femur: Secondary | ICD-10-CM | POA: Diagnosis not present

## 2016-06-28 DIAGNOSIS — M87051 Idiopathic aseptic necrosis of right femur: Secondary | ICD-10-CM | POA: Diagnosis not present

## 2016-06-29 DIAGNOSIS — M25551 Pain in right hip: Secondary | ICD-10-CM | POA: Diagnosis not present

## 2016-07-06 DIAGNOSIS — M87051 Idiopathic aseptic necrosis of right femur: Secondary | ICD-10-CM | POA: Diagnosis not present

## 2016-07-06 DIAGNOSIS — I10 Essential (primary) hypertension: Secondary | ICD-10-CM | POA: Diagnosis not present

## 2016-07-06 DIAGNOSIS — R7303 Prediabetes: Secondary | ICD-10-CM | POA: Diagnosis not present

## 2016-07-06 DIAGNOSIS — Z01810 Encounter for preprocedural cardiovascular examination: Secondary | ICD-10-CM | POA: Diagnosis not present

## 2016-07-07 DIAGNOSIS — Z01818 Encounter for other preprocedural examination: Secondary | ICD-10-CM | POA: Diagnosis not present

## 2016-07-07 DIAGNOSIS — X58XXXA Exposure to other specified factors, initial encounter: Secondary | ICD-10-CM | POA: Diagnosis not present

## 2016-07-07 DIAGNOSIS — S2242XA Multiple fractures of ribs, left side, initial encounter for closed fracture: Secondary | ICD-10-CM | POA: Diagnosis not present

## 2016-07-07 DIAGNOSIS — Z79899 Other long term (current) drug therapy: Secondary | ICD-10-CM | POA: Diagnosis not present

## 2016-07-07 DIAGNOSIS — R52 Pain, unspecified: Secondary | ICD-10-CM | POA: Diagnosis not present

## 2016-07-07 DIAGNOSIS — M79609 Pain in unspecified limb: Secondary | ICD-10-CM | POA: Diagnosis not present

## 2016-07-07 DIAGNOSIS — Z01810 Encounter for preprocedural cardiovascular examination: Secondary | ICD-10-CM | POA: Diagnosis not present

## 2016-07-07 DIAGNOSIS — M87051 Idiopathic aseptic necrosis of right femur: Secondary | ICD-10-CM | POA: Diagnosis not present

## 2016-07-07 DIAGNOSIS — M25551 Pain in right hip: Secondary | ICD-10-CM | POA: Diagnosis not present

## 2016-07-09 DIAGNOSIS — Z6832 Body mass index (BMI) 32.0-32.9, adult: Secondary | ICD-10-CM | POA: Diagnosis not present

## 2016-07-09 DIAGNOSIS — I2510 Atherosclerotic heart disease of native coronary artery without angina pectoris: Secondary | ICD-10-CM | POA: Diagnosis not present

## 2016-07-09 DIAGNOSIS — I10 Essential (primary) hypertension: Secondary | ICD-10-CM | POA: Diagnosis not present

## 2016-07-09 DIAGNOSIS — M87051 Idiopathic aseptic necrosis of right femur: Secondary | ICD-10-CM | POA: Diagnosis not present

## 2016-07-29 DIAGNOSIS — M87051 Idiopathic aseptic necrosis of right femur: Secondary | ICD-10-CM | POA: Diagnosis not present

## 2016-08-05 DIAGNOSIS — M1611 Unilateral primary osteoarthritis, right hip: Secondary | ICD-10-CM | POA: Diagnosis not present

## 2016-08-05 DIAGNOSIS — M87051 Idiopathic aseptic necrosis of right femur: Secondary | ICD-10-CM | POA: Diagnosis not present

## 2016-08-05 DIAGNOSIS — I10 Essential (primary) hypertension: Secondary | ICD-10-CM | POA: Diagnosis not present

## 2016-08-05 DIAGNOSIS — Z79899 Other long term (current) drug therapy: Secondary | ICD-10-CM | POA: Diagnosis not present

## 2016-08-05 DIAGNOSIS — J309 Allergic rhinitis, unspecified: Secondary | ICD-10-CM | POA: Diagnosis not present

## 2016-08-05 DIAGNOSIS — F17210 Nicotine dependence, cigarettes, uncomplicated: Secondary | ICD-10-CM | POA: Diagnosis not present

## 2016-08-05 DIAGNOSIS — R161 Splenomegaly, not elsewhere classified: Secondary | ICD-10-CM | POA: Diagnosis not present

## 2016-08-05 DIAGNOSIS — Z96641 Presence of right artificial hip joint: Secondary | ICD-10-CM | POA: Diagnosis not present

## 2016-08-08 DIAGNOSIS — Z7982 Long term (current) use of aspirin: Secondary | ICD-10-CM | POA: Diagnosis not present

## 2016-08-08 DIAGNOSIS — I10 Essential (primary) hypertension: Secondary | ICD-10-CM | POA: Diagnosis not present

## 2016-08-08 DIAGNOSIS — Z471 Aftercare following joint replacement surgery: Secondary | ICD-10-CM | POA: Diagnosis not present

## 2016-08-08 DIAGNOSIS — Z791 Long term (current) use of non-steroidal anti-inflammatories (NSAID): Secondary | ICD-10-CM | POA: Diagnosis not present

## 2016-08-08 DIAGNOSIS — Z96641 Presence of right artificial hip joint: Secondary | ICD-10-CM | POA: Diagnosis not present

## 2016-08-08 DIAGNOSIS — Z7951 Long term (current) use of inhaled steroids: Secondary | ICD-10-CM | POA: Diagnosis not present

## 2016-08-09 DIAGNOSIS — Z9889 Other specified postprocedural states: Secondary | ICD-10-CM | POA: Diagnosis not present

## 2016-08-09 DIAGNOSIS — D3702 Neoplasm of uncertain behavior of tongue: Secondary | ICD-10-CM | POA: Diagnosis not present

## 2016-08-09 DIAGNOSIS — R490 Dysphonia: Secondary | ICD-10-CM | POA: Diagnosis not present

## 2016-08-09 DIAGNOSIS — J342 Deviated nasal septum: Secondary | ICD-10-CM | POA: Diagnosis not present

## 2016-08-10 DIAGNOSIS — I10 Essential (primary) hypertension: Secondary | ICD-10-CM | POA: Diagnosis not present

## 2016-08-10 DIAGNOSIS — Z96641 Presence of right artificial hip joint: Secondary | ICD-10-CM | POA: Diagnosis not present

## 2016-08-10 DIAGNOSIS — Z7982 Long term (current) use of aspirin: Secondary | ICD-10-CM | POA: Diagnosis not present

## 2016-08-10 DIAGNOSIS — Z791 Long term (current) use of non-steroidal anti-inflammatories (NSAID): Secondary | ICD-10-CM | POA: Diagnosis not present

## 2016-08-10 DIAGNOSIS — Z7951 Long term (current) use of inhaled steroids: Secondary | ICD-10-CM | POA: Diagnosis not present

## 2016-08-10 DIAGNOSIS — Z471 Aftercare following joint replacement surgery: Secondary | ICD-10-CM | POA: Diagnosis not present

## 2016-08-12 DIAGNOSIS — Z96641 Presence of right artificial hip joint: Secondary | ICD-10-CM | POA: Diagnosis not present

## 2016-08-12 DIAGNOSIS — I10 Essential (primary) hypertension: Secondary | ICD-10-CM | POA: Diagnosis not present

## 2016-08-12 DIAGNOSIS — Z7982 Long term (current) use of aspirin: Secondary | ICD-10-CM | POA: Diagnosis not present

## 2016-08-12 DIAGNOSIS — Z791 Long term (current) use of non-steroidal anti-inflammatories (NSAID): Secondary | ICD-10-CM | POA: Diagnosis not present

## 2016-08-12 DIAGNOSIS — Z01810 Encounter for preprocedural cardiovascular examination: Secondary | ICD-10-CM

## 2016-08-12 DIAGNOSIS — Z7951 Long term (current) use of inhaled steroids: Secondary | ICD-10-CM | POA: Diagnosis not present

## 2016-08-12 DIAGNOSIS — Z471 Aftercare following joint replacement surgery: Secondary | ICD-10-CM | POA: Diagnosis not present

## 2016-08-12 HISTORY — DX: Encounter for preprocedural cardiovascular examination: Z01.810

## 2016-08-13 DIAGNOSIS — R931 Abnormal findings on diagnostic imaging of heart and coronary circulation: Secondary | ICD-10-CM

## 2016-08-13 DIAGNOSIS — Z01810 Encounter for preprocedural cardiovascular examination: Secondary | ICD-10-CM | POA: Diagnosis not present

## 2016-08-13 HISTORY — DX: Abnormal findings on diagnostic imaging of heart and coronary circulation: R93.1

## 2016-08-16 DIAGNOSIS — Z7982 Long term (current) use of aspirin: Secondary | ICD-10-CM | POA: Diagnosis not present

## 2016-08-16 DIAGNOSIS — I10 Essential (primary) hypertension: Secondary | ICD-10-CM | POA: Diagnosis not present

## 2016-08-16 DIAGNOSIS — Z791 Long term (current) use of non-steroidal anti-inflammatories (NSAID): Secondary | ICD-10-CM | POA: Diagnosis not present

## 2016-08-16 DIAGNOSIS — C029 Malignant neoplasm of tongue, unspecified: Secondary | ICD-10-CM | POA: Diagnosis not present

## 2016-08-16 DIAGNOSIS — Z7951 Long term (current) use of inhaled steroids: Secondary | ICD-10-CM | POA: Diagnosis not present

## 2016-08-16 DIAGNOSIS — D3702 Neoplasm of uncertain behavior of tongue: Secondary | ICD-10-CM | POA: Diagnosis not present

## 2016-08-16 DIAGNOSIS — Z471 Aftercare following joint replacement surgery: Secondary | ICD-10-CM | POA: Diagnosis not present

## 2016-08-16 DIAGNOSIS — Z96641 Presence of right artificial hip joint: Secondary | ICD-10-CM | POA: Diagnosis not present

## 2016-08-17 DIAGNOSIS — Z01810 Encounter for preprocedural cardiovascular examination: Secondary | ICD-10-CM | POA: Diagnosis not present

## 2016-08-24 DIAGNOSIS — J351 Hypertrophy of tonsils: Secondary | ICD-10-CM | POA: Diagnosis not present

## 2016-08-24 DIAGNOSIS — J342 Deviated nasal septum: Secondary | ICD-10-CM | POA: Diagnosis not present

## 2016-08-24 DIAGNOSIS — R490 Dysphonia: Secondary | ICD-10-CM | POA: Diagnosis not present

## 2016-09-16 DIAGNOSIS — Z96641 Presence of right artificial hip joint: Secondary | ICD-10-CM | POA: Diagnosis not present

## 2016-09-16 DIAGNOSIS — M87051 Idiopathic aseptic necrosis of right femur: Secondary | ICD-10-CM | POA: Diagnosis not present

## 2016-09-17 DIAGNOSIS — L237 Allergic contact dermatitis due to plants, except food: Secondary | ICD-10-CM | POA: Diagnosis not present

## 2016-09-29 DIAGNOSIS — Z96641 Presence of right artificial hip joint: Secondary | ICD-10-CM | POA: Diagnosis not present

## 2016-09-29 DIAGNOSIS — S73004A Unspecified dislocation of right hip, initial encounter: Secondary | ICD-10-CM | POA: Diagnosis not present

## 2016-09-29 DIAGNOSIS — R52 Pain, unspecified: Secondary | ICD-10-CM | POA: Diagnosis not present

## 2016-09-29 DIAGNOSIS — Z471 Aftercare following joint replacement surgery: Secondary | ICD-10-CM | POA: Diagnosis not present

## 2016-09-29 DIAGNOSIS — S299XXA Unspecified injury of thorax, initial encounter: Secondary | ICD-10-CM | POA: Diagnosis not present

## 2016-09-29 DIAGNOSIS — T84020A Dislocation of internal right hip prosthesis, initial encounter: Secondary | ICD-10-CM | POA: Diagnosis not present

## 2016-09-29 DIAGNOSIS — M25551 Pain in right hip: Secondary | ICD-10-CM | POA: Diagnosis not present

## 2016-10-01 DIAGNOSIS — Z96641 Presence of right artificial hip joint: Secondary | ICD-10-CM | POA: Diagnosis not present

## 2016-10-28 DIAGNOSIS — Z96641 Presence of right artificial hip joint: Secondary | ICD-10-CM | POA: Diagnosis not present

## 2016-11-10 DIAGNOSIS — M25512 Pain in left shoulder: Secondary | ICD-10-CM | POA: Diagnosis not present

## 2016-11-10 DIAGNOSIS — L255 Unspecified contact dermatitis due to plants, except food: Secondary | ICD-10-CM | POA: Diagnosis not present

## 2016-11-10 DIAGNOSIS — Z6835 Body mass index (BMI) 35.0-35.9, adult: Secondary | ICD-10-CM | POA: Diagnosis not present

## 2016-11-10 DIAGNOSIS — L299 Pruritus, unspecified: Secondary | ICD-10-CM | POA: Diagnosis not present

## 2016-11-25 DIAGNOSIS — M1611 Unilateral primary osteoarthritis, right hip: Secondary | ICD-10-CM | POA: Diagnosis not present

## 2016-11-25 DIAGNOSIS — Z96641 Presence of right artificial hip joint: Secondary | ICD-10-CM | POA: Diagnosis not present

## 2017-01-12 DIAGNOSIS — Z23 Encounter for immunization: Secondary | ICD-10-CM | POA: Diagnosis not present

## 2017-01-12 DIAGNOSIS — I10 Essential (primary) hypertension: Secondary | ICD-10-CM | POA: Diagnosis not present

## 2017-01-12 DIAGNOSIS — E7800 Pure hypercholesterolemia, unspecified: Secondary | ICD-10-CM | POA: Diagnosis not present

## 2017-01-12 DIAGNOSIS — K625 Hemorrhage of anus and rectum: Secondary | ICD-10-CM | POA: Diagnosis not present

## 2017-01-12 DIAGNOSIS — Z8719 Personal history of other diseases of the digestive system: Secondary | ICD-10-CM | POA: Diagnosis not present

## 2017-01-12 DIAGNOSIS — R7303 Prediabetes: Secondary | ICD-10-CM | POA: Diagnosis not present

## 2017-01-12 DIAGNOSIS — Z9081 Acquired absence of spleen: Secondary | ICD-10-CM | POA: Diagnosis not present

## 2017-01-27 DIAGNOSIS — Z96641 Presence of right artificial hip joint: Secondary | ICD-10-CM | POA: Diagnosis not present

## 2017-02-18 DIAGNOSIS — J45909 Unspecified asthma, uncomplicated: Secondary | ICD-10-CM | POA: Diagnosis not present

## 2017-02-18 DIAGNOSIS — R1084 Generalized abdominal pain: Secondary | ICD-10-CM | POA: Diagnosis not present

## 2017-02-18 DIAGNOSIS — K219 Gastro-esophageal reflux disease without esophagitis: Secondary | ICD-10-CM | POA: Diagnosis not present

## 2017-02-18 DIAGNOSIS — R6881 Early satiety: Secondary | ICD-10-CM | POA: Diagnosis not present

## 2017-03-17 DIAGNOSIS — K589 Irritable bowel syndrome without diarrhea: Secondary | ICD-10-CM | POA: Diagnosis not present

## 2017-03-17 DIAGNOSIS — M5416 Radiculopathy, lumbar region: Secondary | ICD-10-CM | POA: Diagnosis not present

## 2017-03-25 DIAGNOSIS — M5136 Other intervertebral disc degeneration, lumbar region: Secondary | ICD-10-CM | POA: Diagnosis not present

## 2017-03-25 DIAGNOSIS — M5416 Radiculopathy, lumbar region: Secondary | ICD-10-CM | POA: Diagnosis not present

## 2017-03-25 DIAGNOSIS — M5116 Intervertebral disc disorders with radiculopathy, lumbar region: Secondary | ICD-10-CM | POA: Diagnosis not present

## 2017-05-10 ENCOUNTER — Encounter: Payer: Self-pay | Admitting: Gastroenterology

## 2017-05-26 ENCOUNTER — Encounter: Payer: Self-pay | Admitting: Gastroenterology

## 2017-06-11 IMAGING — CR DG CERVICAL SPINE 2 OR 3 VIEWS
4 series · 4 of 4 positions shown · non-contrast
Comparison: None.

CLINICAL DATA: Patient fell off a boat and hit hand 3 days ago with
neck pain and left arm pain

EXAM:
CERVICAL SPINE - 2-3 VIEW

[w c-spine lat]
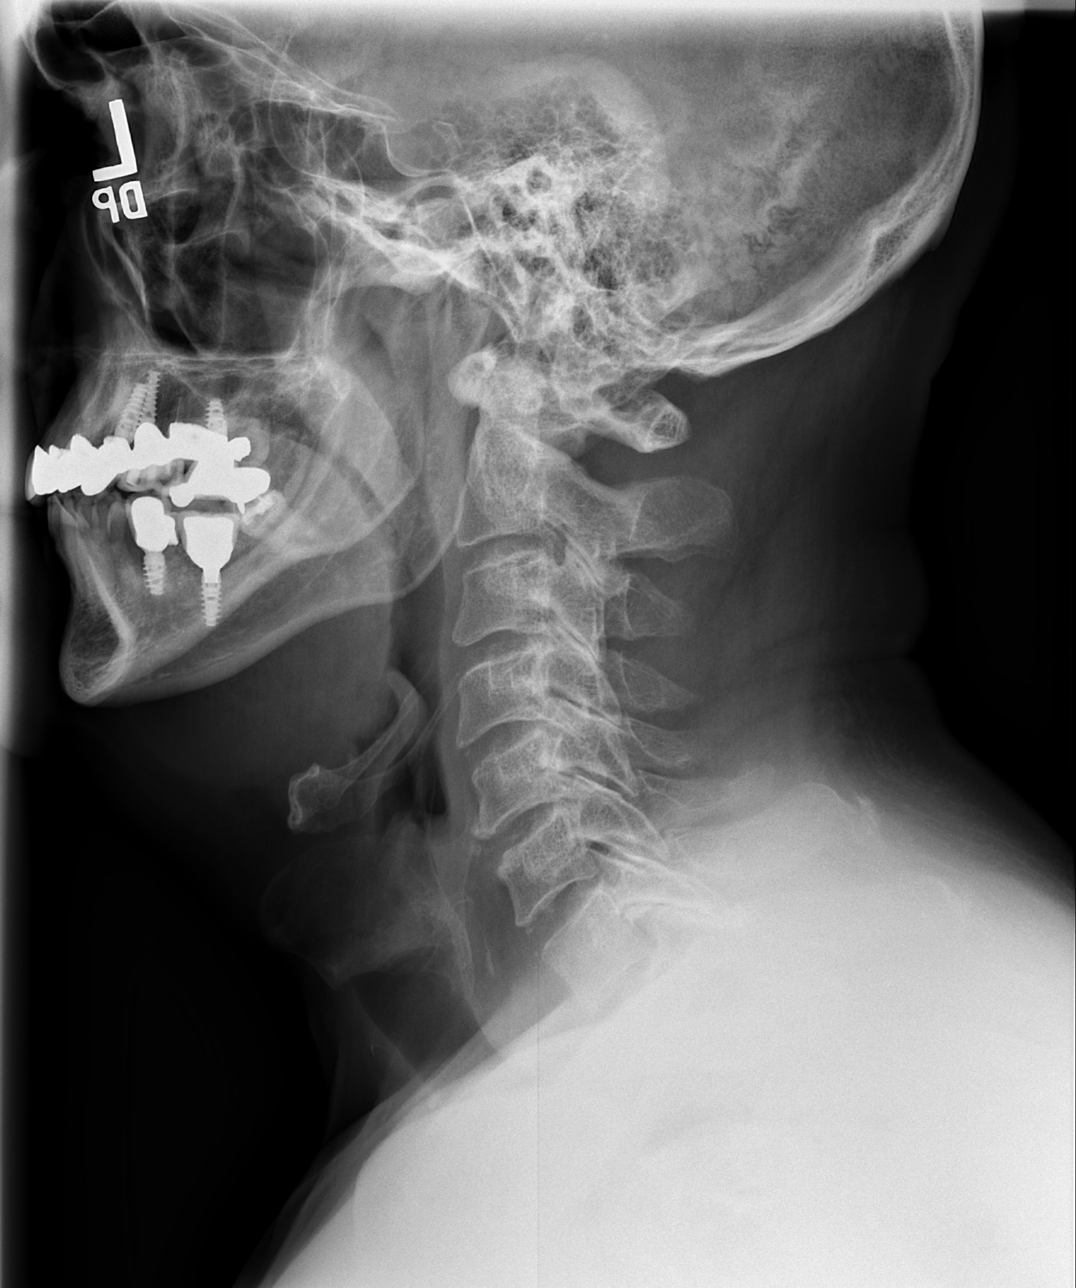

[w c-spine a.p. *]
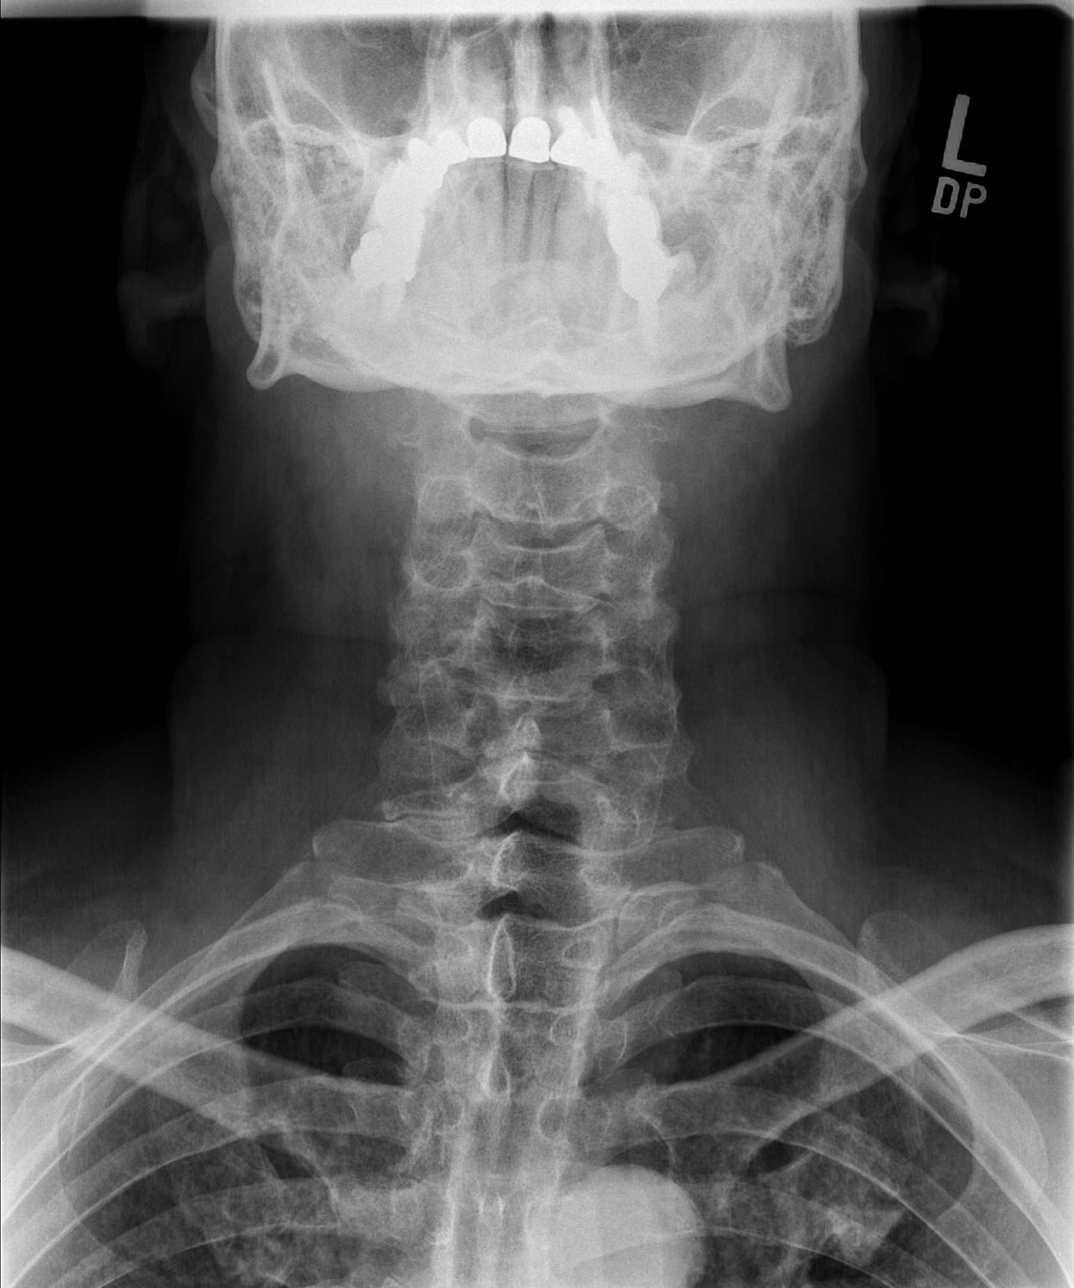

[w c-spine odontoid * (1 of 2)]
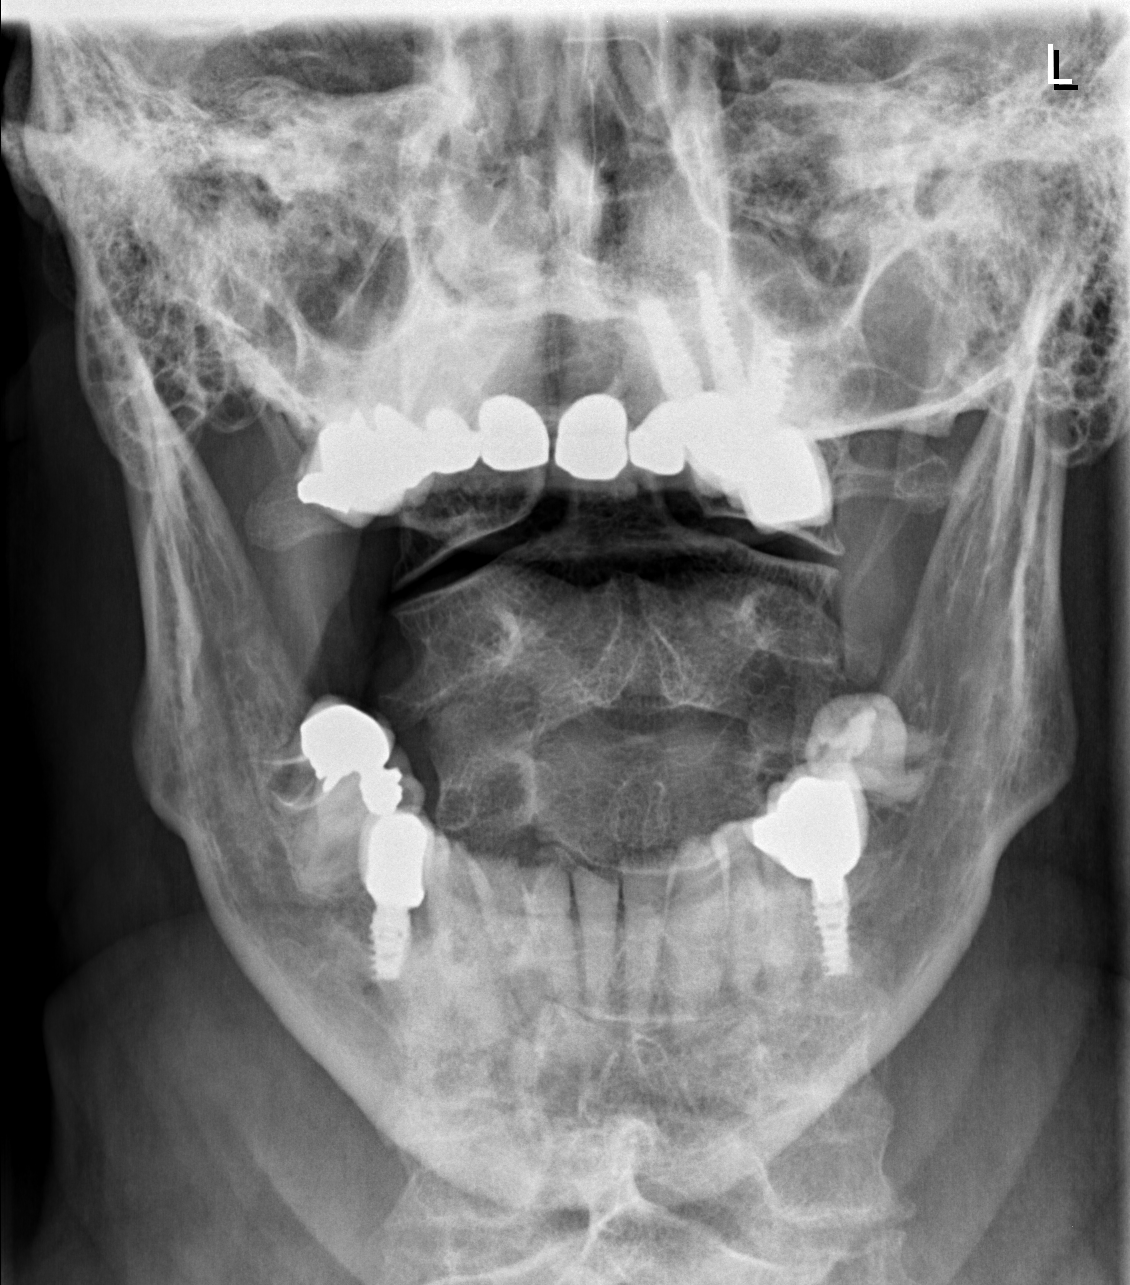

[w c-spine odontoid * (2 of 2)]
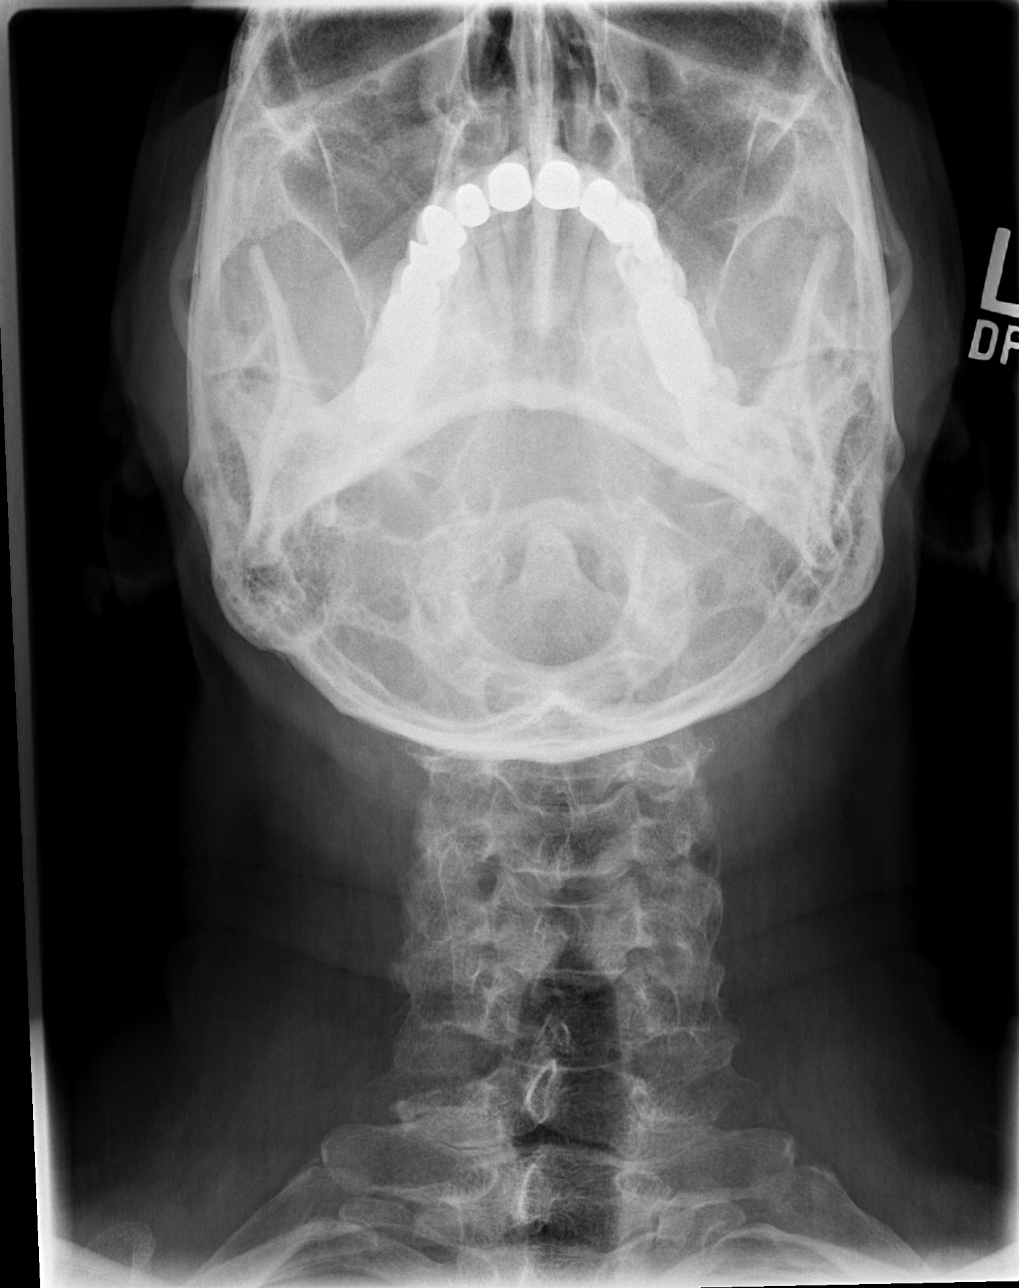

[4 of 4 positions shown; findings below may reference images not displayed]

FINDINGS: No prevertebral soft tissue swelling. Normal alignment. No fracture.
Mild C7-T1 degenerative facet change. Minimal C4-5, mild C5-6, mild
C6-7 degenerative disc disease.
IMPRESSION: Mild degenerative change with no acute finding

## 2017-06-24 DIAGNOSIS — M5116 Intervertebral disc disorders with radiculopathy, lumbar region: Secondary | ICD-10-CM | POA: Diagnosis not present

## 2017-06-24 DIAGNOSIS — M5137 Other intervertebral disc degeneration, lumbosacral region: Secondary | ICD-10-CM | POA: Diagnosis not present

## 2017-06-24 DIAGNOSIS — M5416 Radiculopathy, lumbar region: Secondary | ICD-10-CM | POA: Diagnosis not present

## 2017-06-27 DIAGNOSIS — Z6834 Body mass index (BMI) 34.0-34.9, adult: Secondary | ICD-10-CM | POA: Diagnosis not present

## 2017-06-27 DIAGNOSIS — M26629 Arthralgia of temporomandibular joint, unspecified side: Secondary | ICD-10-CM | POA: Diagnosis not present

## 2017-06-27 DIAGNOSIS — Z1331 Encounter for screening for depression: Secondary | ICD-10-CM | POA: Diagnosis not present

## 2017-06-27 DIAGNOSIS — Z9081 Acquired absence of spleen: Secondary | ICD-10-CM | POA: Diagnosis not present

## 2017-07-09 DIAGNOSIS — T783XXA Angioneurotic edema, initial encounter: Secondary | ICD-10-CM | POA: Diagnosis not present

## 2017-07-09 DIAGNOSIS — I10 Essential (primary) hypertension: Secondary | ICD-10-CM | POA: Diagnosis not present

## 2017-07-09 DIAGNOSIS — T781XXA Other adverse food reactions, not elsewhere classified, initial encounter: Secondary | ICD-10-CM | POA: Diagnosis not present

## 2017-07-13 DIAGNOSIS — I10 Essential (primary) hypertension: Secondary | ICD-10-CM | POA: Diagnosis not present

## 2017-07-13 DIAGNOSIS — E7800 Pure hypercholesterolemia, unspecified: Secondary | ICD-10-CM | POA: Diagnosis not present

## 2017-07-13 DIAGNOSIS — J45909 Unspecified asthma, uncomplicated: Secondary | ICD-10-CM | POA: Diagnosis not present

## 2017-07-13 DIAGNOSIS — Z9081 Acquired absence of spleen: Secondary | ICD-10-CM | POA: Diagnosis not present

## 2017-07-13 DIAGNOSIS — R7303 Prediabetes: Secondary | ICD-10-CM | POA: Diagnosis not present

## 2017-07-13 DIAGNOSIS — Z125 Encounter for screening for malignant neoplasm of prostate: Secondary | ICD-10-CM | POA: Diagnosis not present

## 2017-08-10 DIAGNOSIS — J3089 Other allergic rhinitis: Secondary | ICD-10-CM | POA: Diagnosis not present

## 2017-08-10 DIAGNOSIS — J4530 Mild persistent asthma, uncomplicated: Secondary | ICD-10-CM | POA: Diagnosis not present

## 2017-08-10 DIAGNOSIS — J309 Allergic rhinitis, unspecified: Secondary | ICD-10-CM | POA: Diagnosis not present

## 2017-08-10 DIAGNOSIS — J301 Allergic rhinitis due to pollen: Secondary | ICD-10-CM | POA: Diagnosis not present

## 2017-08-10 DIAGNOSIS — T781XXA Other adverse food reactions, not elsewhere classified, initial encounter: Secondary | ICD-10-CM | POA: Diagnosis not present

## 2017-08-26 DIAGNOSIS — Q8901 Asplenia (congenital): Secondary | ICD-10-CM | POA: Diagnosis not present

## 2017-08-26 DIAGNOSIS — Z6832 Body mass index (BMI) 32.0-32.9, adult: Secondary | ICD-10-CM | POA: Diagnosis not present

## 2017-08-26 DIAGNOSIS — L259 Unspecified contact dermatitis, unspecified cause: Secondary | ICD-10-CM | POA: Diagnosis not present

## 2017-08-29 DIAGNOSIS — Z96641 Presence of right artificial hip joint: Secondary | ICD-10-CM | POA: Diagnosis not present

## 2017-08-29 DIAGNOSIS — M1611 Unilateral primary osteoarthritis, right hip: Secondary | ICD-10-CM | POA: Diagnosis not present

## 2017-09-07 DIAGNOSIS — M5416 Radiculopathy, lumbar region: Secondary | ICD-10-CM | POA: Diagnosis not present

## 2017-09-22 DIAGNOSIS — R9439 Abnormal result of other cardiovascular function study: Secondary | ICD-10-CM

## 2017-09-22 DIAGNOSIS — I10 Essential (primary) hypertension: Secondary | ICD-10-CM | POA: Diagnosis not present

## 2017-09-22 HISTORY — DX: Abnormal result of other cardiovascular function study: R94.39

## 2017-09-28 DIAGNOSIS — K635 Polyp of colon: Secondary | ICD-10-CM | POA: Diagnosis not present

## 2017-09-28 DIAGNOSIS — Z800 Family history of malignant neoplasm of digestive organs: Secondary | ICD-10-CM | POA: Diagnosis not present

## 2017-09-28 DIAGNOSIS — D123 Benign neoplasm of transverse colon: Secondary | ICD-10-CM | POA: Diagnosis not present

## 2017-09-28 DIAGNOSIS — Z86010 Personal history of colonic polyps: Secondary | ICD-10-CM | POA: Diagnosis not present

## 2017-09-28 DIAGNOSIS — Z1211 Encounter for screening for malignant neoplasm of colon: Secondary | ICD-10-CM | POA: Diagnosis not present

## 2017-10-13 DIAGNOSIS — G4733 Obstructive sleep apnea (adult) (pediatric): Secondary | ICD-10-CM | POA: Diagnosis not present

## 2017-10-13 DIAGNOSIS — M5416 Radiculopathy, lumbar region: Secondary | ICD-10-CM | POA: Diagnosis not present

## 2017-10-13 DIAGNOSIS — L309 Dermatitis, unspecified: Secondary | ICD-10-CM | POA: Diagnosis not present

## 2017-10-13 DIAGNOSIS — M9983 Other biomechanical lesions of lumbar region: Secondary | ICD-10-CM | POA: Diagnosis not present

## 2017-10-24 DIAGNOSIS — R0602 Shortness of breath: Secondary | ICD-10-CM | POA: Diagnosis not present

## 2017-10-24 DIAGNOSIS — G4733 Obstructive sleep apnea (adult) (pediatric): Secondary | ICD-10-CM | POA: Diagnosis not present

## 2017-11-01 DIAGNOSIS — G4733 Obstructive sleep apnea (adult) (pediatric): Secondary | ICD-10-CM | POA: Diagnosis not present

## 2017-11-01 DIAGNOSIS — R0602 Shortness of breath: Secondary | ICD-10-CM | POA: Diagnosis not present

## 2017-11-02 DIAGNOSIS — G4733 Obstructive sleep apnea (adult) (pediatric): Secondary | ICD-10-CM | POA: Diagnosis not present

## 2017-11-02 DIAGNOSIS — R0602 Shortness of breath: Secondary | ICD-10-CM | POA: Diagnosis not present

## 2018-01-10 ENCOUNTER — Emergency Department (HOSPITAL_COMMUNITY): Payer: PRIVATE HEALTH INSURANCE

## 2018-01-10 ENCOUNTER — Encounter (HOSPITAL_COMMUNITY)
Admission: EM | Disposition: A | Discharge status: Home / Self Care | Payer: Self-pay | Source: Home / Self Care | Attending: Emergency Medicine

## 2018-01-10 ENCOUNTER — Encounter (HOSPITAL_COMMUNITY): Payer: Self-pay

## 2018-01-10 ENCOUNTER — Other Ambulatory Visit: Payer: Self-pay

## 2018-01-10 ENCOUNTER — Observation Stay (HOSPITAL_COMMUNITY)
Admission: EM | Admit: 2018-01-10 | Discharge: 2018-01-11 | Disposition: A | Discharge status: Home / Self Care | Payer: PRIVATE HEALTH INSURANCE | Attending: Interventional Cardiology | Admitting: Interventional Cardiology

## 2018-01-10 DIAGNOSIS — E669 Obesity, unspecified: Secondary | ICD-10-CM | POA: Insufficient documentation

## 2018-01-10 DIAGNOSIS — I420 Dilated cardiomyopathy: Secondary | ICD-10-CM | POA: Insufficient documentation

## 2018-01-10 DIAGNOSIS — I4510 Unspecified right bundle-branch block: Secondary | ICD-10-CM | POA: Insufficient documentation

## 2018-01-10 DIAGNOSIS — I061 Rheumatic aortic insufficiency: Secondary | ICD-10-CM | POA: Insufficient documentation

## 2018-01-10 DIAGNOSIS — Z91030 Bee allergy status: Secondary | ICD-10-CM | POA: Insufficient documentation

## 2018-01-10 DIAGNOSIS — I25110 Atherosclerotic heart disease of native coronary artery with unstable angina pectoris: Principal | ICD-10-CM | POA: Insufficient documentation

## 2018-01-10 DIAGNOSIS — I200 Unstable angina: Secondary | ICD-10-CM

## 2018-01-10 DIAGNOSIS — Z79899 Other long term (current) drug therapy: Secondary | ICD-10-CM | POA: Insufficient documentation

## 2018-01-10 DIAGNOSIS — I10 Essential (primary) hypertension: Secondary | ICD-10-CM

## 2018-01-10 DIAGNOSIS — Z9081 Acquired absence of spleen: Secondary | ICD-10-CM | POA: Insufficient documentation

## 2018-01-10 DIAGNOSIS — Z6831 Body mass index (BMI) 31.0-31.9, adult: Secondary | ICD-10-CM | POA: Insufficient documentation

## 2018-01-10 DIAGNOSIS — G4730 Sleep apnea, unspecified: Secondary | ICD-10-CM | POA: Insufficient documentation

## 2018-01-10 DIAGNOSIS — G4733 Obstructive sleep apnea (adult) (pediatric): Secondary | ICD-10-CM

## 2018-01-10 DIAGNOSIS — Z91013 Allergy to seafood: Secondary | ICD-10-CM | POA: Insufficient documentation

## 2018-01-10 DIAGNOSIS — R918 Other nonspecific abnormal finding of lung field: Secondary | ICD-10-CM | POA: Insufficient documentation

## 2018-01-10 DIAGNOSIS — K219 Gastro-esophageal reflux disease without esophagitis: Secondary | ICD-10-CM | POA: Insufficient documentation

## 2018-01-10 HISTORY — DX: Personal history of other medical treatment: Z92.89

## 2018-01-10 HISTORY — DX: Unspecified asthma, uncomplicated: J45.909

## 2018-01-10 HISTORY — PX: CARDIAC CATHETERIZATION: SHX172

## 2018-01-10 HISTORY — DX: Obstructive sleep apnea (adult) (pediatric): G47.33

## 2018-01-10 HISTORY — PX: LEFT HEART CATH AND CORONARY ANGIOGRAPHY: CATH118249

## 2018-01-10 HISTORY — DX: Acute myocardial infarction, unspecified: I21.9

## 2018-01-10 HISTORY — DX: Obstructive sleep apnea (adult) (pediatric): Z99.89

## 2018-01-10 LAB — CBC
HCT: 48.7 % (ref 39.0–52.0)
HCT: 47.3 % (ref 39.0–52.0)
HEMOGLOBIN: 16.1 g/dL (ref 13.0–17.0)
Hemoglobin: 15.8 g/dL (ref 13.0–17.0)
MCH: 30.7 pg (ref 26.0–34.0)
MCH: 30.6 pg (ref 26.0–34.0)
MCHC: 33.1 g/dL (ref 30.0–36.0)
MCHC: 33.4 g/dL (ref 30.0–36.0)
MCV: 92.9 fL (ref 80.0–100.0)
MCV: 91.7 fL (ref 80.0–100.0)
Platelets: 388 10*3/uL (ref 150–400)
Platelets: 402 10*3/uL — ABNORMAL HIGH (ref 150–400)
RBC: 5.24 MIL/uL (ref 4.22–5.81)
RBC: 5.16 MIL/uL (ref 4.22–5.81)
RDW: 15.5 % (ref 11.5–15.5)
RDW: 15.2 % (ref 11.5–15.5)
WBC: 6 10*3/uL (ref 4.0–10.5)
WBC: 6.9 10*3/uL (ref 4.0–10.5)
nRBC: 0 % (ref 0.0–0.2)
nRBC: 0 % (ref 0.0–0.2)

## 2018-01-10 LAB — BASIC METABOLIC PANEL
ANION GAP: 8 (ref 5–15)
BUN: 14 mg/dL (ref 8–23)
CALCIUM: 8.6 mg/dL — AB (ref 8.9–10.3)
CO2: 22 mmol/L (ref 22–32)
Chloride: 107 mmol/L (ref 98–111)
Creatinine, Ser: 1.05 mg/dL (ref 0.61–1.24)
GFR calc non Af Amer: >60 mL/min (ref >60)
Glucose, Bld: 107 mg/dL — ABNORMAL HIGH (ref 70–99)
Potassium: 3.7 mmol/L (ref 3.5–5.1)
Sodium: 137 mmol/L (ref 135–145)

## 2018-01-10 LAB — LIPID PANEL
Cholesterol: 172 mg/dL (ref 0–200)
HDL: 39 mg/dL — AB (ref >40)
LDL Cholesterol: 98 mg/dL (ref 0–99)
TRIGLYCERIDES: 176 mg/dL — AB (ref <150)
Total CHOL/HDL Ratio: 4.4 RATIO
VLDL: 35 mg/dL (ref 0–40)

## 2018-01-10 LAB — CREATININE, SERUM
CREATININE: 1.02 mg/dL (ref 0.61–1.24)
GFR calc Af Amer: >60 mL/min (ref >60)

## 2018-01-10 LAB — I-STAT TROPONIN, ED: TROPONIN I, POC: 0 ng/mL (ref 0.00–0.08)

## 2018-01-10 SURGERY — LEFT HEART CATH AND CORONARY ANGIOGRAPHY
Anesthesia: LOCAL

## 2018-01-10 MED ORDER — FENTANYL CITRATE (PF) 100 MCG/2ML IJ SOLN
INTRAMUSCULAR | Status: DC | PRN
  Administered 2018-01-10: 50 ug via INTRAVENOUS

## 2018-01-10 MED ORDER — NITROGLYCERIN 0.4 MG SL SUBL
0.4000 mg | SUBLINGUAL_TABLET | SUBLINGUAL | Status: DC | PRN
  Administered 2018-01-10: 0.4 mg via SUBLINGUAL
  Filled 2018-01-10: qty 1

## 2018-01-10 MED ORDER — HEPARIN SODIUM (PORCINE) 5000 UNIT/ML IJ SOLN
5000.0000 [IU] | Freq: Three times a day (TID) | INTRAMUSCULAR | Status: DC
  Administered 2018-01-10 – 2018-01-11 (×2): 5000 [IU] via SUBCUTANEOUS
  Filled 2018-01-10 (×2): qty 1

## 2018-01-10 MED ORDER — DICYCLOMINE HCL 10 MG PO CAPS
10.0000 mg | ORAL_CAPSULE | Freq: Every day | ORAL | Status: DC
  Administered 2018-01-10: 10 mg via ORAL
  Filled 2018-01-10: qty 1

## 2018-01-10 MED ORDER — MORPHINE SULFATE (PF) 4 MG/ML IV SOLN
4.0000 mg | Freq: Once | INTRAVENOUS | Status: AC
  Administered 2018-01-10: 4 mg via INTRAVENOUS
  Filled 2018-01-10: qty 1

## 2018-01-10 MED ORDER — ACETAMINOPHEN 325 MG PO TABS: 650.0000 mg | ORAL_TABLET | ORAL | Status: DC | PRN

## 2018-01-10 MED ORDER — IOHEXOL 350 MG/ML SOLN
INTRAVENOUS | Status: DC | PRN
  Administered 2018-01-10: 150 mL via INTRACARDIAC

## 2018-01-10 MED ORDER — SODIUM CHLORIDE 0.9% FLUSH
3.0000 mL | Freq: Two times a day (BID) | INTRAVENOUS | Status: DC
  Administered 2018-01-10 – 2018-01-11 (×2): 3 mL via INTRAVENOUS

## 2018-01-10 MED ORDER — HEPARIN (PORCINE) IN NACL 1000-0.9 UT/500ML-% IV SOLN
INTRAVENOUS | Status: DC | PRN
  Administered 2018-01-10 (×2): 500 mL

## 2018-01-10 MED ORDER — LOSARTAN POTASSIUM 50 MG PO TABS
100.0000 mg | ORAL_TABLET | Freq: Every day | ORAL | Status: DC
  Administered 2018-01-11: 100 mg via ORAL
  Filled 2018-01-10: qty 2

## 2018-01-10 MED ORDER — GABAPENTIN 300 MG PO CAPS
300.0000 mg | ORAL_CAPSULE | Freq: Two times a day (BID) | ORAL | Status: DC
  Administered 2018-01-10 – 2018-01-11 (×2): 300 mg via ORAL
  Filled 2018-01-10 (×2): qty 1

## 2018-01-10 MED ORDER — ASPIRIN EC 81 MG PO TBEC: 81.0000 mg | DELAYED_RELEASE_TABLET | Freq: Every day | ORAL | Status: DC

## 2018-01-10 MED ORDER — SODIUM CHLORIDE 0.9 % WEIGHT BASED INFUSION: 1.0000 mL/kg/h | INTRAVENOUS | Status: DC

## 2018-01-10 MED ORDER — LOSARTAN POTASSIUM-HCTZ 100-12.5 MG PO TABS: 1.0000 | ORAL_TABLET | Freq: Every day | ORAL | Status: DC

## 2018-01-10 MED ORDER — VERAPAMIL HCL 2.5 MG/ML IV SOLN
INTRAVENOUS | Status: DC | PRN
  Administered 2018-01-10: 10 mL via INTRA_ARTERIAL

## 2018-01-10 MED ORDER — ONDANSETRON HCL 4 MG/2ML IJ SOLN: 4.0000 mg | Freq: Four times a day (QID) | INTRAMUSCULAR | Status: DC | PRN

## 2018-01-10 MED ORDER — HEPARIN SODIUM (PORCINE) 1000 UNIT/ML IJ SOLN
INTRAMUSCULAR | Status: DC | PRN
  Administered 2018-01-10: 5000 [IU] via INTRAVENOUS

## 2018-01-10 MED ORDER — MOMETASONE FURO-FORMOTEROL FUM 200-5 MCG/ACT IN AERO
2.0000 | INHALATION_SPRAY | Freq: Two times a day (BID) | RESPIRATORY_TRACT | Status: DC
  Administered 2018-01-10 – 2018-01-11 (×2): 2 via RESPIRATORY_TRACT
  Filled 2018-01-10: qty 8.8

## 2018-01-10 MED ORDER — HEPARIN (PORCINE) IN NACL 1000-0.9 UT/500ML-% IV SOLN
INTRAVENOUS | Status: AC
  Filled 2018-01-10: qty 500

## 2018-01-10 MED ORDER — ASPIRIN 81 MG PO CHEW: 81.0000 mg | CHEWABLE_TABLET | ORAL | Status: DC

## 2018-01-10 MED ORDER — SODIUM CHLORIDE 0.9 % IV SOLN: 250.0000 mL | INTRAVENOUS | Status: DC | PRN

## 2018-01-10 MED ORDER — VERAPAMIL HCL 2.5 MG/ML IV SOLN
INTRAVENOUS | Status: AC
  Filled 2018-01-10: qty 2

## 2018-01-10 MED ORDER — MIDAZOLAM HCL 2 MG/2ML IJ SOLN
INTRAMUSCULAR | Status: AC
  Filled 2018-01-10: qty 2

## 2018-01-10 MED ORDER — SODIUM CHLORIDE 0.9% FLUSH: 3.0000 mL | INTRAVENOUS | Status: DC | PRN

## 2018-01-10 MED ORDER — SODIUM CHLORIDE 0.9% FLUSH: 3.0000 mL | Freq: Two times a day (BID) | INTRAVENOUS | Status: DC

## 2018-01-10 MED ORDER — NITROGLYCERIN IN D5W 200-5 MCG/ML-% IV SOLN
0.0000 ug/min | INTRAVENOUS | Status: DC
  Administered 2018-01-10: 5 ug/min via INTRAVENOUS
  Filled 2018-01-10: qty 250

## 2018-01-10 MED ORDER — LIDOCAINE HCL (PF) 1 % IJ SOLN
INTRAMUSCULAR | Status: AC
  Filled 2018-01-10: qty 30

## 2018-01-10 MED ORDER — ATORVASTATIN CALCIUM 80 MG PO TABS
80.0000 mg | ORAL_TABLET | Freq: Every day | ORAL | Status: DC
  Administered 2018-01-10: 80 mg via ORAL
  Filled 2018-01-10: qty 1

## 2018-01-10 MED ORDER — SODIUM CHLORIDE 0.9 % WEIGHT BASED INFUSION
3.0000 mL/kg/h | INTRAVENOUS | Status: AC
  Administered 2018-01-10: 3 mL/kg/h via INTRAVENOUS

## 2018-01-10 MED ORDER — SODIUM CHLORIDE 0.9 % IV SOLN
INTRAVENOUS | Status: AC
  Administered 2018-01-10: 16:00:00 via INTRAVENOUS

## 2018-01-10 MED ORDER — NITROGLYCERIN 0.4 MG SL SUBL: 0.4000 mg | SUBLINGUAL_TABLET | SUBLINGUAL | Status: DC | PRN

## 2018-01-10 MED ORDER — FENTANYL CITRATE (PF) 100 MCG/2ML IJ SOLN
INTRAMUSCULAR | Status: AC
  Filled 2018-01-10: qty 2

## 2018-01-10 MED ORDER — HEPARIN SODIUM (PORCINE) 1000 UNIT/ML IJ SOLN
INTRAMUSCULAR | Status: AC
  Filled 2018-01-10: qty 1

## 2018-01-10 MED ORDER — NITROGLYCERIN 2 % TD OINT
1.0000 [in_us] | TOPICAL_OINTMENT | Freq: Once | TRANSDERMAL | Status: DC
  Filled 2018-01-10: qty 1

## 2018-01-10 MED ORDER — MIDAZOLAM HCL 2 MG/2ML IJ SOLN
INTRAMUSCULAR | Status: DC | PRN
  Administered 2018-01-10: 2 mg via INTRAVENOUS

## 2018-01-10 MED ORDER — HYDROCHLOROTHIAZIDE 12.5 MG PO CAPS
12.5000 mg | ORAL_CAPSULE | Freq: Every day | ORAL | Status: DC
  Administered 2018-01-11: 12.5 mg via ORAL
  Filled 2018-01-10: qty 1

## 2018-01-10 MED ORDER — PANTOPRAZOLE SODIUM 40 MG PO TBEC
40.0000 mg | DELAYED_RELEASE_TABLET | Freq: Every day | ORAL | Status: DC
  Administered 2018-01-11: 40 mg via ORAL
  Filled 2018-01-10: qty 1

## 2018-01-10 MED ORDER — LIDOCAINE HCL (PF) 1 % IJ SOLN
INTRAMUSCULAR | Status: DC | PRN
  Administered 2018-01-10: 2 mL

## 2018-01-10 MED ORDER — ASPIRIN EC 81 MG PO TBEC
81.0000 mg | DELAYED_RELEASE_TABLET | Freq: Every day | ORAL | Status: DC
  Administered 2018-01-11: 81 mg via ORAL
  Filled 2018-01-10: qty 1

## 2018-01-10 SURGICAL SUPPLY — 16 items
CATH 5FR JL3.5 JR4 ANG PIG MP (CATHETERS) ×2 IMPLANT
CATH INFINITI 5 FR 3DRC (CATHETERS) ×2 IMPLANT
CATH LAUNCHER 5F RADR (CATHETERS) ×1 IMPLANT
CATH LAUNCHER 6FR JR4 (CATHETERS) ×2 IMPLANT
CATH OPTITORQUE TIG 4.0 5F (CATHETERS) ×2 IMPLANT
CATHETER LAUNCHER 5F RADR (CATHETERS) ×2
DEVICE RAD COMP TR BAND LRG (VASCULAR PRODUCTS) ×2 IMPLANT
GLIDESHEATH SLEND A-KIT 6F 22G (SHEATH) ×2 IMPLANT
GUIDEWIRE INQWIRE 1.5J.035X260 (WIRE) ×1 IMPLANT
INQWIRE 1.5J .035X260CM (WIRE) ×2
KIT HEART LEFT (KITS) ×2 IMPLANT
PACK CARDIAC CATHETERIZATION (CUSTOM PROCEDURE TRAY) ×2 IMPLANT
SHEATH PROBE COVER 6X72 (BAG) ×2 IMPLANT
SYR MEDRAD MARK 7 150ML (SYRINGE) ×2 IMPLANT
TRANSDUCER W/STOPCOCK (MISCELLANEOUS) ×2 IMPLANT
TUBING CIL FLEX 10 FLL-RA (TUBING) ×2 IMPLANT

## 2018-01-10 NOTE — ED Notes (Signed)
Pt ambulated to bathroom, denies any increased sob, cp or dizziness

## 2018-01-10 NOTE — ED Notes (Signed)
Pt had relief with nitro pain went from a 4 to a 2 out 10

## 2018-01-10 NOTE — ED Provider Notes (Signed)
Jose Clay Provider Note   CSN: [id number] Arrival date & time: 01/10/18  0815     History   Chief Complaint Chief Complaint  Patient presents with  . Chest Pain    HPI Jose Clay is a 62 y.o. male.  He has a history of GERD and hypertension and possibly had a MI in the past noted on an EKG during her preop surgical evaluation.  He said he had a little bit of chest discomfort on Sunday and that went away on its own.  Today while walking into his truck to go to work at around 7 am he experienced 6 out of 10 substernal chest pain radiating to his left axilla with associated shortness of breath and feeling lightheaded.  Ultimately called 911 and the initial EKG was unremarkable.  EMS gave him aspirin and a nitro and his symptoms are improved to 2 out of 10 pain on arrival here.  [name] he is never had this pain before other than on Sunday something like it.  He also started with a little bit of a cough this morning.  No fevers no chills no nausea no vomiting no urinary symptoms no rashes.  No numbness or weakness.  The history is provided by the patient.  Chest Pain   This is a new problem. The current episode started 1 to 2 hours ago. The problem occurs constantly. The problem has been gradually improving. The pain is associated with walking. The pain is present in the substernal region. The pain is at a severity of 6/10. The quality of the pain is described as pressure-like. The pain radiates to the left shoulder. Duration of episode(s) is 1 hour. Associated symptoms include cough, nausea and shortness of breath. Pertinent negatives include no abdominal pain, no back pain, no diaphoresis, no fever, no headaches, no leg pain, no lower extremity edema and no syncope. He has tried nitroglycerin for the symptoms. The treatment provided moderate relief.  His past medical history is significant for CAD and hypertension.    Past Medical History:  Diagnosis  Date  . GERD (gastroesophageal reflux disease)   . Hypertension     Patient Active Problem List   Diagnosis Date Noted  . RUQ abdominal pain 08/22/2015  . Pancreatitis, acute 08/22/2015    Past Surgical History:  Procedure Laterality Date  . SPLENECTOMY          Home Medications    Prior to Admission medications   Medication Sig Start Date End Date Taking? Authorizing Provider  amoxicillin-clavulanate (AUGMENTIN) 500-125 MG tablet Take 1 tablet by mouth 2 (two) times daily. 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    [provider]  dexamethasone (DECADRON) 2 MG tablet Take all 5 tablets on Friday, April 13. 05/27/16   Molpus, Jenny Reichmann, MD  diphenhydrAMINE (BENADRYL) 25 mg capsule Take 2 capsules, 50 milligrams, every 6 hours for the next 3 days and then as needed for allergy symptoms. 05/27/16   Molpus, John, MD  EPINEPHrine (EPIPEN 2-PAK) 0.3 mg/0.3 mL IJ SOAJ injection Self inject per package instructions as needed for severe allergic reaction and seek medical attention. Pharmacist: Substitution permitted. 05/27/16   Molpus, John, MD  esomeprazole (NEXIUM) 40 MG capsule Take 40 mg by mouth daily at 12 noon.    [provider]  losartan-hydrochlorothiazide (HYZAAR) 100-12.5 MG tablet Take 1 tablet by mouth daily. for blood pressure 06/03/15  [provider]  ondansetron (ZOFRAN-ODT) 8 MG disintegrating tablet Take 1 tablet (8 mg total) by mouth every 8 (eight) hours as needed for nausea or vomiting. 07/29/15   Davonna Belling, MD  oxyCODONE-acetaminophen (PERCOCET/ROXICET) 5-325 MG tablet Take 1-2 tablets by mouth every 6 (six) hours as needed for severe pain. 07/29/15   Davonna Belling, MD    Family History Family History  Problem Relation Age of Onset  . Colon cancer Father   . Diabetes Mother     Social History Social History   Tobacco Use  . Smoking status: Never Smoker  .  Smokeless tobacco: Never Used  Substance Use Topics  . Alcohol use: No    Alcohol/week: 0.0 standard drinks  . Drug use: No     Allergies   Bee venom and Codeine   Review of Systems Review of Systems  Constitutional: Negative for diaphoresis and fever.  HENT: Negative for sore throat.   Eyes: Negative for visual disturbance.  Respiratory: Positive for cough and shortness of breath.   Cardiovascular: Positive for chest pain. Negative for syncope.  Gastrointestinal: Positive for nausea. Negative for abdominal pain.  Genitourinary: Negative for dysuria.  Musculoskeletal: Negative for back pain.  Skin: Negative for rash.  Neurological: Negative for headaches.     Physical Exam Updated Vital Signs BP (!) 140/93 (BP Location: Left Arm)   Pulse 60   Temp 98 F (36.7 C) (Oral)   Resp 18   Ht 6' (1.829 m)   Wt 108.8 kg   SpO2 96%   BMI 32.54 kg/m   Physical Exam  Constitutional: He is oriented to person, place, and time. He appears well-developed and well-nourished.  HENT:  Head: Normocephalic and atraumatic.  Eyes: Conjunctivae are normal.  Neck: Neck supple.  Cardiovascular: Normal rate, regular rhythm and normal pulses.  No murmur heard. Pulmonary/Chest: Effort normal and breath sounds normal. No respiratory distress.  Abdominal: Soft. There is no tenderness.  Musculoskeletal: Normal range of motion. He exhibits no edema.       Right lower leg: Normal. He exhibits no tenderness and no edema.       Left lower leg: Normal. He exhibits no tenderness and no edema.  Neurological: He is alert and oriented to person, place, and time. He has normal strength. No sensory deficit. GCS eye subscore is 4. GCS verbal subscore is 5. GCS motor subscore is 6.  Skin: Skin is warm and dry. Capillary refill takes less than 2 seconds.  Psychiatric: He has a normal mood and affect.  Nursing note and vitals reviewed.    ED Treatments / Results  Labs (all labs ordered are listed, but  only abnormal results are displayed) Labs Reviewed  BASIC METABOLIC PANEL - Abnormal; Notable for the following components:      Result Value   Glucose, Bld 107 (*)    Calcium 8.6 (*)    All other components within normal limits  LIPID PANEL - Abnormal; Notable for the following components:   Triglycerides 176 (*)    HDL 39 (*)    All other components within normal limits  CBC - Abnormal; Notable for the following components:   Platelets 402 (*)    All other components within normal limits  CBC  CREATININE, SERUM  HIV ANTIBODY (ROUTINE TESTING W REFLEX)  BASIC METABOLIC PANEL  CBC  I-STAT TROPONIN, ED    EKG EKG Interpretation  Date/Time:  Tuesday January 10 2018 08:25:33 EST Ventricular Rate:  61 PR Interval:  QRS Duration: 167 QT Interval:  456 QTC Calculation: 460 R Axis:   -10 Text Interpretation:  Sinus rhythm Right bundle branch block new RBBB from prior 6/17 Confirmed by Aletta Edouard [phone]) on 01/10/2018 8:34:04 AM   Radiology Dg Chest Port 1 View  Result Date: 01/10/2018 CLINICAL DATA:  Chest pain. EXAM: PORTABLE CHEST 1 VIEW COMPARISON:  09/30/2011. FINDINGS: Mediastinum and hilar structures normal. Heart size normal. Low lung volumes with mild bibasilar atelectasis/scarring. Mild left base subsegmental atelectasis and or scarring. No pleural effusion or pneumothorax. IMPRESSION: Mild bibasilar subsegmental atelectasis/scarring. Electronically Signed   By: Marcello Moores  Register   On: 01/10/2018 09:10    Procedures .Critical Care Performed by: Hayden Rasmussen, MD Authorized by: Hayden Rasmussen, MD   Critical care provider statement:    Critical care time (minutes):  45   Critical care time was exclusive of:  Separately billable procedures and treating other patients   Critical care was necessary to treat or prevent imminent or life-threatening deterioration of the following conditions:  Cardiac failure   Critical care was time spent personally by me on  the following activities:  Discussions with consultants, evaluation of patient's response to treatment, examination of patient, ordering and performing treatments and interventions, ordering and review of laboratory studies, ordering and review of radiographic studies, pulse oximetry, re-evaluation of patient's condition, obtaining history from patient or surrogate and review of old charts   I assumed direction of critical care for this patient from another provider in my specialty: no   Comments:     Unstable angina going to cath lab   (including critical care time)  Medications Ordered in ED Medications  nitroGLYCERIN (NITROSTAT) SL tablet 0.4 mg (has no administration in time range)     Initial Impression / Assessment and Plan / ED Course  I have reviewed the triage vital signs and the nursing notes.  Pertinent labs & imaging results that were available during my care of the patient were reviewed by me and considered in my medical decision making (see chart for details).  Clinical Course as of Jan 11 1047  Tue Jan 10, 2018  8416 Patient received 1 nitro here with improvement in his pain although to go back and check on him now he said it has recurred.  I ordered him some Nitropaste morphine.  Initial troponin negative.  I placed a call into cardiology due to this unstable angina picture with a new right bundle branch block.   [MB]  0908 Discussed with cardiology master who will have a consult and come evaluate the patient in the ED.   [MB]  1001 Cardiology is evaluated the patient and they are planning on taking the patient to the Cath Lab.  They have ordered a nitroglycerin drip for the patient.   [MB]    Clinical Course User Index [MB] Hayden Rasmussen, MD      Final Clinical Impressions(s) / ED Diagnoses   Final diagnoses:  Unstable angina [facility])    ED Discharge Orders    None       Hayden Rasmussen, MD 01/10/18 1945

## 2018-01-10 NOTE — Progress Notes (Signed)
Patient came to floor on a nitro gtt- 3east unable to take this kind of gtt. AD called to have gtt d/c. Patient in room with family at bedside

## 2018-01-10 NOTE — Progress Notes (Signed)
Spoke to pharmacist- she will move 4pm aspirin to tomr morning.

## 2018-01-10 NOTE — ED Triage Notes (Signed)
Pt brought in by EMS due to having chest pain that started this morning. Pt endorse SOb. Pt denies n/v. Pt received 324mg  of aspirin and nitrx2. Pt rating chest pain 2/10.

## 2018-01-10 NOTE — Progress Notes (Signed)
  Pt admitted to the unit. Pt is stable, alert and oriented per baseline. Oriented to room, staff, and call bell. Educated to call for any assistance. Bed in lowest position, call bell within reach- will continue to monitor. 

## 2018-01-10 NOTE — Interval H&P Note (Signed)
History and Physical Interval Note:  01/10/2018 11:06 AM  Jose Clay  has presented today for surgery, with the diagnosis of UNSTABLE ANGINA.   The various methods of treatment have been discussed with the patient and family. After consideration of risks, benefits and other options for treatment, the patient has consented to  Procedure(s): LEFT HEART CATH AND CORONARY ANGIOGRAPHY (N/A) with possible PERCUTANEOUS CORONARY INTERVENTION  as a surgical intervention .  The patient's history has been reviewed, patient examined, no change in status, stable for surgery.  I have reviewed the patient's chart and labs.  Questions were answered to the patient's satisfaction.      Cath Lab Visit (complete for each Cath Lab visit)  Clinical Evaluation Leading to the Procedure:   ACS: Yes.    Non-ACS:    Anginal Classification: CCS IV  Anti-ischemic medical therapy: Minimal Therapy (1 class of medications)  Non-Invasive Test Results: No non-invasive testing performed  Prior CABG: No previous CABG   Glenetta Hew

## 2018-01-10 NOTE — H&P (Addendum)
Cardiology Admission History and Physical:   Patient ID: Jose Clay MRN: [id number]; DOB: Feb 18, 1955   Admission date: 01/10/2018  Primary Care Provider: Cyndi Bender, PA-C Primary Cardiologist:New to [facility]  Chief Complaint:  CP  Patient Profile:   Jose Clay is a 62 y.o. male with history of hypertension and untreated sleep apnea presented for evaluation of chest pain.  Patient was previously seen by cardiologist at [facility] for preoperative clearance.  Stress test was abnormal with fixed defect.  No further work-up warranted.  Echocardiogram showed normal LV function.  History of Present Illness:   Jose Clay presented with worsening chest pain.  For the past few weeks he has exertional chest pain with shortness of breath which relieved with rest.  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  [place] noted [place] symptoms requiring frequent stop.  This morning he had a worse episode while walking to his truck.  He noted severe substernal chest pain associated with shortness of breath and radiating underneath his left axilla.  He drove himself to his shop was felt extremely dizzy with worsening of symptoms.  Never lost consciousness.  EMS gave him aspirin 324 mg with 2 sublingual nitroglycerin.  His symptoms improved.  He additionally got 1 sublingual nitroglycerin in the emergency room with improved chest pain from 6-7 out of 10 to 2/10.  Patient has intermittent orthopnea, lower extremity edema and PND.  He was started CPAP.  Remote smoking quit 40 years ago.  Denies family history of CAD. Patient has history of esophageal spasm but this episode is totally different.   Past Medical History:  Diagnosis Date  . GERD (gastroesophageal reflux disease)   . Hypertension   . Sleep apnea     Past Surgical History:  Procedure Laterality Date  . SPLENECTOMY       Medications Prior to Admission: Prior to Admission  medications   Medication Sig Start Date End Date Taking? Authorizing Provider  amoxicillin-clavulanate (AUGMENTIN) 500-125 MG tablet Take 1 tablet by mouth 2 (two) times daily. 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    [provider]  dexamethasone (DECADRON) 2 MG tablet Take all 5 tablets on Friday, April 13. 05/27/16   Molpus, Jenny Reichmann, MD  diphenhydrAMINE (BENADRYL) 25 mg capsule Take 2 capsules, 50 milligrams, every 6 hours for the next 3 days and then as needed for allergy symptoms. 05/27/16   Molpus, John, MD  EPINEPHrine (EPIPEN 2-PAK) 0.3 mg/0.3 mL IJ SOAJ injection Self inject per package instructions as needed for severe allergic reaction and seek medical attention. Pharmacist: Substitution permitted. 05/27/16   Molpus, John, MD  esomeprazole (NEXIUM) 40 MG capsule Take 40 mg by mouth daily at 12 noon.    [provider]  losartan-hydrochlorothiazide (HYZAAR) 100-12.5 MG tablet Take 1 tablet by mouth daily. for blood pressure 06/03/15   [provider]  ondansetron (ZOFRAN-ODT) 8 MG disintegrating tablet Take 1 tablet (8 mg total) by mouth every 8 (eight) hours as needed for nausea or vomiting. 07/29/15   Davonna Belling, MD  oxyCODONE-acetaminophen (PERCOCET/ROXICET) 5-325 MG tablet Take 1-2 tablets by mouth every 6 (six) hours as needed for severe pain. 07/29/15   Davonna Belling, MD     Allergies:    Allergies  Allergen Reactions  . Bee Venom Swelling  . Shellfish Allergy Shortness Of Breath, Swelling and Rash  Oyster, scallops     Social History:   Social History   Socioeconomic History  . Marital status: Married    Spouse name: Not on file  . Number of children: Not on file  . Years of education: Not on file  . Highest education level: Not on file  Occupational History  . Not on file  Social Needs  . Financial resource strain: Not on file  . Food insecurity:    Worry: Not on file     Inability: Not on file  . Transportation needs:    Medical: Not on file    Non-medical: Not on file  Tobacco Use  . Smoking status: Never Smoker  . Smokeless tobacco: Never Used  Substance and Sexual Activity  . Alcohol use: No    Alcohol/week: 0.0 standard drinks  . Drug use: No  . Sexual activity: Not on file  Lifestyle  . Physical activity:    Days per week: Not on file    Minutes per session: Not on file  . Stress: Not on file  Relationships  . Social connections:    Talks on phone: Not on file    Gets together: Not on file    Attends religious service: Not on file    Active member of club or organization: Not on file    Attends meetings of clubs or organizations: Not on file    Relationship status: Not on file  . Intimate partner violence:    Fear of current or ex partner: Not on file    Emotionally abused: Not on file    Physically abused: Not on file    Forced sexual activity: Not on file  Other Topics Concern  . Not on file  Social History Narrative  . Not on file    Family History:  The patient's family history includes Colon cancer in his father; Diabetes in his mother.    ROS:  Please see the history of present illness.  All other ROS reviewed and negative.     Physical Exam/Data:   Vitals:   01/10/18 0845 01/10/18 0900 01/10/18 0915 01/10/18 0945  BP: (!) 150/95 (!) 134/96 (!) 139/96 (!) 155/105  Pulse: 62 64 (!) 58 63  Resp: (!) 21 14 15    Temp:      TempSrc:      SpO2: 93% (!) 86% 92% 97%  Weight:      Height:       No intake or output data in the 24 hours ending 01/10/18 1002 Filed Weights   01/10/18 0827  Weight: 104.3 kg   Body mass index is 31.19 kg/m.  General:  Well nourished, well developed, in no acute distress HEENT: normal Lymph: no adenopathy Neck: no  JVD Endocrine:  No thryomegaly Vascular: No carotid bruits; FA pulses 2+ bilaterally without bruits  Cardiac:  normal S1, S2; RRR; no murmur Lungs:  clear to auscultation  bilaterally, no wheezing, rhonchi or rales  Abd: soft, nontender, no hepatomegaly  Ext: no edema Musculoskeletal:  No deformities, BUE and BLE strength normal and equal Skin: warm and dry  Neuro:  CNs 2-12 intact, no focal abnormalities noted Psych:  Normal affect    EKG:  The ECG that was done today was personally reviewed and demonstrates: Normal sinus rhythm with new right bundle blanch block  Relevant CV Studies: As summarized above  Laboratory Data: Hematology Recent Labs  Lab 01/10/18 0830  WBC 6.0  RBC 5.24  HGB 16.1  HCT 48.7  MCV 92.9  MCH 30.7  MCHC 33.1  RDW 15.5  PLT 388   Cardiac EnzymesNo results for input(s): TROPONINI in the last 168 hours.  Recent Labs  Lab 01/10/18 0833  TROPIPOC 0.00     Radiology/Studies:  Dg Chest Port 1 View  Result Date: 01/10/2018 CLINICAL DATA:  Chest pain. EXAM: PORTABLE CHEST 1 VIEW COMPARISON:  09/30/2011. FINDINGS: Mediastinum and hilar structures normal. Heart size normal. Low lung volumes with mild bibasilar atelectasis/scarring. Mild left base subsegmental atelectasis and or scarring. No pleural effusion or pneumothorax. IMPRESSION: Mild bibasilar subsegmental atelectasis/scarring. Electronically Signed   By: Marcello Moores  Register   On: 01/10/2018 09:10    Assessment and Plan:   1.  Unstable angina Few week history of exertional chest pressure associated with shortness of breath which relieved with rest.  Worse episode this morning radiating underneath left axilla.  Point-of-care troponin negative.  EKG with new right bundle branch block.  His cardiac risk factor includes hypertension, obesity and untreated sleep apnea.  Start IV nitro. The patient understands that risks include but are not limited to stroke (1 in 1000), death (1 in 87), kidney failure [usually temporary] (1 in 500), bleeding (1 in 200), allergic reaction [possibly serious] (1 in 200), and agrees to proceed. check Lipid panel. Get echo.   2.  Sleep  apnea -Encourage compliance with CPAP  3.  Hypertension -Elevated.  Start nitro drip as above.  Resume home Hyzaar post-cath.  Hold adding beta-blocker until evidence of CAD.  Patient has history of asthma.  Severity of Illness: The appropriate patient status for this patient is OBSERVATION. Observation status is judged to be reasonable and necessary in order to provide the required intensity of service to ensure the patient's safety. The patient's presenting symptoms, physical exam findings, and initial radiographic and laboratory data in the context of their medical condition is felt to place them at decreased risk for further clinical deterioration. Furthermore, it is anticipated that the patient will be medically stable for discharge from the hospital within 2 midnights of admission. The following factors support the patient status of observation.   " The patient's presenting symptoms include Chest pain and SOB. " The physical exam findings include obesity  " The initial radiographic and laboratory data are new RBBB     For questions or updates, please contact [place] Please consult [email or web address] for contact info under        SignedLeanor Kail, PA  01/10/2018 10:02 AM   I have examined the patient and reviewed assessment and plan and discussed with patient.  Agree with above as stated.  Sx consistent with unstable angina.  Plan for cath today.  Cardiac catheterization was discussed with the patient fully. The patient understands that risks include but are not limited to stroke (1 in 1000), death (1 in 22), kidney failure [usually temporary] (1 in 500), bleeding (1 in 200), allergic reaction [possibly serious] (1 in 200).  The patient understands and is willing to proceed.    Longterm, he will need RF modification.  He has had difficulty with weight gain due to chronic steroid use. Check lipids and treat HTN.  Further plans based on cath.  Larae Grooms

## 2018-01-11 ENCOUNTER — Ambulatory Visit (HOSPITAL_BASED_OUTPATIENT_CLINIC_OR_DEPARTMENT_OTHER): Payer: PRIVATE HEALTH INSURANCE

## 2018-01-11 ENCOUNTER — Encounter (HOSPITAL_COMMUNITY): Payer: Self-pay | Admitting: Cardiology

## 2018-01-11 DIAGNOSIS — I10 Essential (primary) hypertension: Secondary | ICD-10-CM | POA: Diagnosis not present

## 2018-01-11 DIAGNOSIS — I351 Nonrheumatic aortic (valve) insufficiency: Secondary | ICD-10-CM

## 2018-01-11 DIAGNOSIS — I200 Unstable angina: Secondary | ICD-10-CM | POA: Diagnosis not present

## 2018-01-11 DIAGNOSIS — G4733 Obstructive sleep apnea (adult) (pediatric): Secondary | ICD-10-CM | POA: Diagnosis not present

## 2018-01-11 LAB — BASIC METABOLIC PANEL
Anion gap: 8 (ref 5–15)
BUN: 12 mg/dL (ref 8–23)
CHLORIDE: 105 mmol/L (ref 98–111)
CO2: 25 mmol/L (ref 22–32)
CREATININE: 1.13 mg/dL (ref 0.61–1.24)
Calcium: 9 mg/dL (ref 8.9–10.3)
GFR calc Af Amer: >60 mL/min (ref >60)
GLUCOSE: 99 mg/dL (ref 70–99)
POTASSIUM: 3.8 mmol/L (ref 3.5–5.1)
Sodium: 138 mmol/L (ref 135–145)

## 2018-01-11 LAB — ECHOCARDIOGRAM COMPLETE
Height: 72 in
Weight: 3843.2 oz

## 2018-01-11 LAB — TROPONIN I: TROPONIN I: 0.03 ng/mL — AB (ref <0.03)

## 2018-01-11 LAB — CBC
HCT: 46.2 % (ref 39.0–52.0)
HEMOGLOBIN: 15.3 g/dL (ref 13.0–17.0)
MCH: 30.4 pg (ref 26.0–34.0)
MCHC: 33.1 g/dL (ref 30.0–36.0)
MCV: 91.7 fL (ref 80.0–100.0)
NRBC: 0 % (ref 0.0–0.2)
PLATELETS: 387 10*3/uL (ref 150–400)
RBC: 5.04 MIL/uL (ref 4.22–5.81)
RDW: 15.4 % (ref 11.5–15.5)
WBC: 7.8 10*3/uL (ref 4.0–10.5)

## 2018-01-11 LAB — HIV ANTIBODY (ROUTINE TESTING W REFLEX): HIV Screen 4th Generation wRfx: NONREACTIVE

## 2018-01-11 LAB — D-DIMER, QUANTITATIVE (NOT AT ARMC)

## 2018-01-11 MED ORDER — ASPIRIN 81 MG PO TBEC: 81.0000 mg | DELAYED_RELEASE_TABLET | Freq: Every day | ORAL | Status: DC

## 2018-01-11 NOTE — Progress Notes (Signed)
  Echocardiogram 2D Echocardiogram has been performed.  Jose Clay 01/11/2018, 8:11 AM

## 2018-01-11 NOTE — Discharge Summary (Addendum)
Discharge Summary    Patient ID: Jose Clay MRN: [id number]; DOB: [birth year]  Admit date: 01/10/2018 Discharge date: 01/11/2018  Primary Care Provider: Cyndi Bender, PA-C  Primary Cardiologist: Dr. Irish Lack   Discharge Diagnoses    Active Problems:   Unstable angina ([place])   HTN   Untreated sleep apnea   Obesity  Allergies Allergies  Allergen Reactions  . Bee Venom Swelling  . Shellfish Allergy Shortness Of Breath, Swelling and Rash    Oyster, scallops     Diagnostic Studies/Procedures    Echo 01/11/18 Study Conclusions  - Left ventricle: The cavity size was normal. Wall thickness was   increased in a pattern of mild LVH. Systolic function was normal.   The estimated ejection fraction was in the range of 50% to 55%.   Wall motion was normal; there were no regional wall motion   abnormalities. Left ventricular diastolic function parameters   were normal. - Aortic valve: There was mild regurgitation. - Mitral valve: There was no significant regurgitation. Valve area   by pressure half-time: 1.77 cm^2. - Left atrium: The atrium was mildly dilated. - Right ventricle: Systolic function was normal. - Atrial septum: No defect or patent foramen ovale was identified. - Pulmonic valve: There was no significant regurgitation.  Impressions:  - Normal LV EF without wall motion abnormalities.   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  The left ventricular systolic function is normal. The left ventricular ejection fraction is 55-65% by visual estimate.  LV end diastolic pressure is normal.   SUMMARY:  Angiographically minimal CAD with exception of mild mid-distal LAD myocardial bridging.  Normal LV size and function  RECOMMENDATIONS:  Evaluate for noncardiac cause for chest pain.  Recommend Aspirin 81mg  daily for moderate CAD.     History of  Present Illness     Jose Clay is a 62 y.o. male with history of hypertension and untreated sleep apnea presented for evaluation of chest pain.  Patient was previously seen by cardiologist at [facility] for preoperative clearance.  Stress test was abnormal with fixed defect.  No further work-up warranted.  Echocardiogram showed normal LV function.  Jose Clay presented with worsening chest pain.  For the past few weeks he has exertional chest pain with shortness of breath which relieved with rest.  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  [place] and noted recurrent symptoms requiring frequent stop. He had a worse episode while walking to his truck on day of presentation.  He noted severe substernal chest pain associated with shortness of breath and radiating underneath his left axilla.  He drove himself to his shop and felt extremely dizzy with worsening of symptoms.  Never lost consciousness.  EMS gave him aspirin 324 mg with 2 sublingual nitroglycerin.  His symptoms improved.  He additionally got 1 sublingual nitroglycerin in the emergency room with improved chest pain from 6-7 out of 10 to 2/10.  Patient has intermittent orthopnea, lower extremity edema and PND.  He was started CPAP.  Remote smoking quit 40 years ago.  Denies family history of CAD. Patient has history of esophageal spasm but this episode is totally different.  Hospital Course     Consultants: None  Due to symptoms concerning for unstable angina with ongoing chest discomfort he was admitted and started on IV nitro. Cardiac cath  on same da showed mid LAD lesion is 45% stenosed 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 Normal LVEDP. On day of discharge, he indicated his pain at epigastric area rather than substernal which is different from presenting description. Echo showed normal LVEF, grade 1 DD, no WM abnormality. D-dimer negative. His pain could be from his  esophageal spasm. Advised to follow up GI/PCP.   She has been seen by Dr. Irish Lack today and deemed ready for discharge home. All follow-up appointments have been scheduled. Discharge medications are listed below.   Discharge Vitals Blood pressure 108/73, pulse 61, temperature 97.7 F (36.5 C), temperature source Oral, resp. rate 18, height 6' (1.829 m), weight 109 kg, SpO2 98 %.  Filed Weights   01/10/18 0827 01/10/18 1605 01/11/18 0534  Weight: 104.3 kg 108.8 kg 109 kg   Physical Exam  Constitutional: He is well-developed, well-nourished, and in no distress.  HENT:  Head: Normocephalic and atraumatic.  Eyes: Pupils are equal, round, and reactive to light. Conjunctivae are normal.  Neck: Normal range of motion. Neck supple.  Cardiovascular: Normal rate and regular rhythm.  Right radial cath without hematoma  Pulmonary/Chest: Effort normal and breath sounds normal.  Abdominal: Soft. Bowel sounds are normal.   Labs & Radiologic Studies    CBC Recent Labs    01/10/18 1617 01/11/18 0539  WBC 6.9 7.8  HGB 15.8 15.3  HCT 47.3 46.2  MCV 91.7 91.7  PLT 402* 947   Basic Metabolic Panel Recent Labs    01/10/18 0929 01/10/18 1617 01/11/18 0539  NA 137  --  138  K 3.7  --  3.8  CL 107  --  105  CO2 22  --  25  GLUCOSE 107*  --  99  BUN 14  --  12  CREATININE 1.05 1.02 1.13  CALCIUM 8.6*  --  9.0   Fasting Lipid Panel Recent Labs    01/10/18 0929  CHOL 172  HDL 39*  LDLCALC 98  TRIG 176*  CHOLHDL 4.4   Dg Chest Port 1 View  Result Date: 01/10/2018 CLINICAL DATA:  Chest pain. EXAM: PORTABLE CHEST 1 VIEW COMPARISON:  09/30/2011. FINDINGS: Mediastinum and hilar structures normal. Heart size normal. Low lung volumes with mild bibasilar atelectasis/scarring. Mild left base subsegmental atelectasis and or scarring. No pleural effusion or pneumothorax. IMPRESSION: Mild bibasilar subsegmental atelectasis/scarring. Electronically Signed   By: Marcello Moores  Register   On:  01/10/2018 09:10   Disposition   Pt is being discharged home today in good condition.  Follow-up Plans & Appointments    Follow-up Information    Revankar, Reita Cliche, MD. Go on 02/02/2018.   Specialty:  Cardiology Why:  @9am  for hospital follow up Contact information: [address] [place] [address] [phone]        Cyndi Bender, PA-C. Schedule an appointment as soon as possible for a visit in 3 day(s).   Specialty:  Physician Assistant Why:  for hospital follow up  Contact information: [place] [address] [phone]          Discharge Instructions    Diet - low sodium heart healthy   Complete by:  As directed    Discharge instructions   Complete by:  As directed    No driving for 48 hours. No lifting over 5 lbs for 1 week. No sexual activity for 1 week. You may return to work on 01/16/18. Keep procedure site clean & dry. If you notice increased pain, swelling,  bleeding or pus, call/return!  You may shower, but no soaking baths/hot tubs/pools for 1 week.   Increase activity slowly   Complete by:  As directed       Discharge Medications   Allergies as of 01/11/2018      Reactions   Bee Venom Swelling   Shellfish Allergy Shortness Of Breath, Swelling, Rash   Oyster, scallops       Medication List    TAKE these medications   aspirin 81 MG EC tablet Take 1 tablet (81 mg total) by mouth daily. Start taking on:  01/12/2018   budesonide-formoterol 160-4.5 MCG/ACT inhaler Commonly known as:  SYMBICORT Inhale 2 puffs into the lungs 2 (two) times daily.   EPINEPHrine 0.3 mg/0.3 mL Soaj injection Commonly known as:  EPI-PEN Self inject per package instructions as needed for severe allergic reaction and seek medical attention. Pharmacist: Substitution permitted.   esomeprazole 40 MG capsule Commonly known as:  NEXIUM Take 80 mg by mouth daily at 12 noon.   gabapentin 300 MG capsule Commonly known as:  NEURONTIN Take 300 mg by  mouth 2 (two) times daily.   losartan-hydrochlorothiazide 100-12.5 MG tablet Commonly known as:  HYZAAR Take 1 tablet by mouth daily. for blood pressure   VENTOLIN HFA 108 (90 Base) MCG/ACT inhaler Generic drug:  albuterol Inhale 2 puffs into the lungs as needed.        Acute coronary syndrome (MI, NSTEMI, STEMI, etc) this admission?: No.    Outstanding Labs/Studies   None  Duration of Discharge Encounter   Greater than 30 minutes including physician time.  SignedCrista Luria Kerhonkson, PA 01/11/2018, 12:15 PM   I have examined the patient and reviewed assessment and plan and discussed with patient.  Agree with above as stated.    GEN: Well nourished, well developed, in no acute distress  HEENT: normal  Neck: no JVD, carotid bruits, or masses Cardiac: RRR; no murmurs, rubs, or gallops,no edema  Respiratory:  clear to auscultation bilaterally, normal work of breathing GI: soft, nontender, nondistended,  MS: no deformity or atrophy  Skin: warm and dry, no rash Neuro:  Strength and sensation are intact Psych: euthymic mood, full affect  DOIng well.  No chest pain.  Ruled out for MI.  Negative cath.  Negative D-dimer.  Benign echo findings.  OK for discharge.    Larae Grooms

## 2018-01-25 IMAGING — CT CT ABD-PELV W/ CM
1 of 12 series · 12 of 37 positions shown, 15 images · IV contrast (Iohexol (Omnipaque 350))
Comparison: Chest radiograph 07/29/2015

CLINICAL DATA: Left-sided chest pain. Left upper quadrant
tenderness.

EXAM:
CT ANGIOGRAPHY CHEST
CT ABDOMEN AND PELVIS WITH CONTRAST
TECHNIQUE: Multidetector CT imaging of the chest was performed using the
standard protocol during bolus administration of intravenous
contrast. Multiplanar CT image reconstructions and MIPs were
obtained to evaluate the vascular anatomy. Multidetector CT imaging
of the abdomen and pelvis was performed using the standard protocol
during bolus administration of intravenous contrast.
CONTRAST:  100 mL Isovue 370

[Series 406: thins pacs · axial · 0.78mm/px · z∈[-13,+259]mm · 12 of 322 slices shown, 15 images]
[im 25/322  mediastinal]
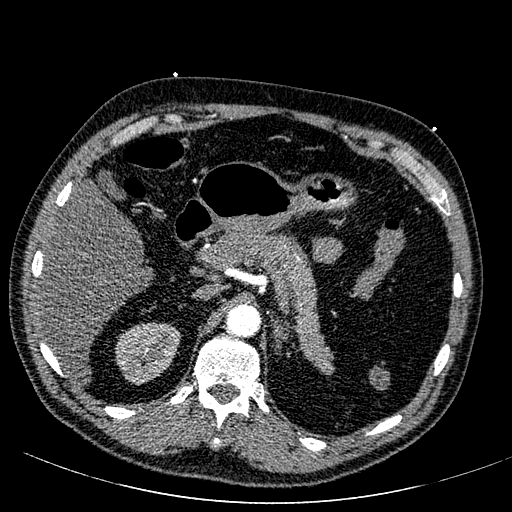
[im 25/322  lung]
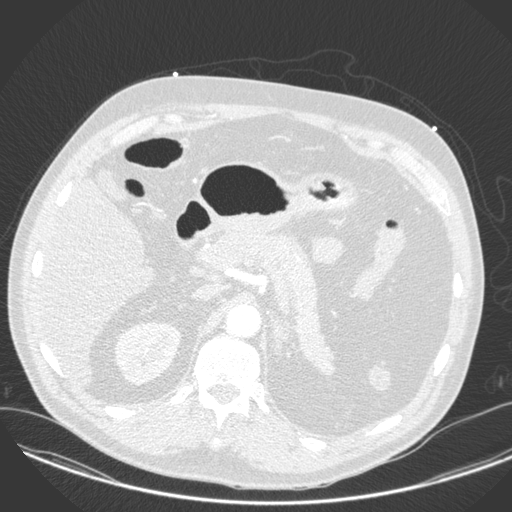
[im 50/322  lung]
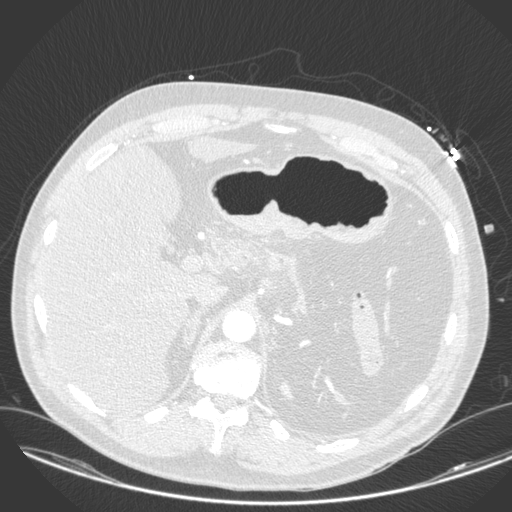
[im 75/322  lung]
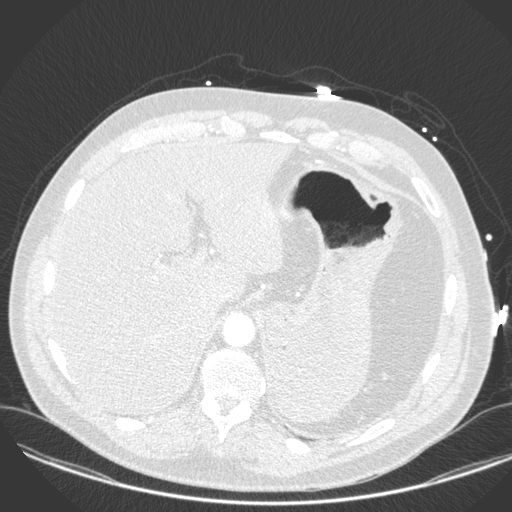
[im 99/322  lung]
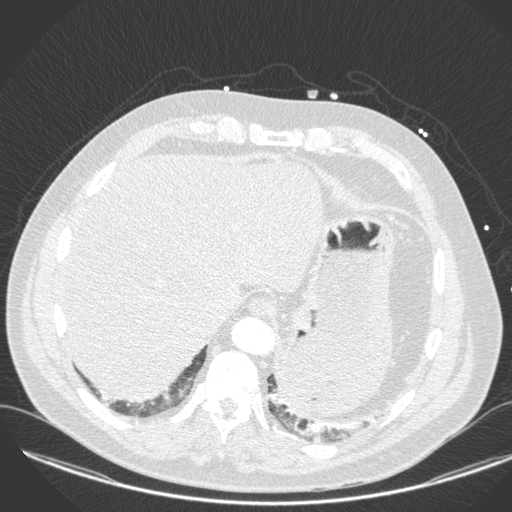
[im 124/322  mediastinal]
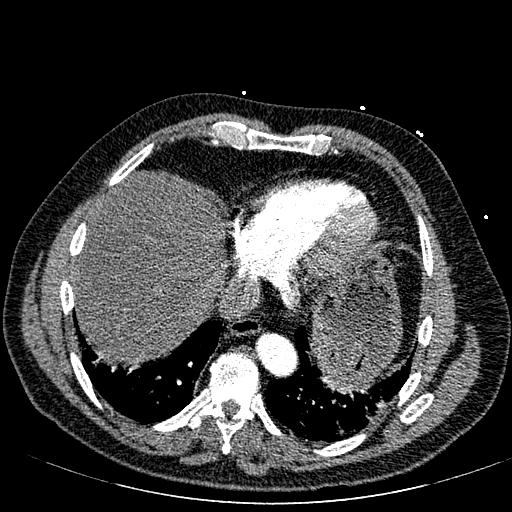
[im 124/322  lung]
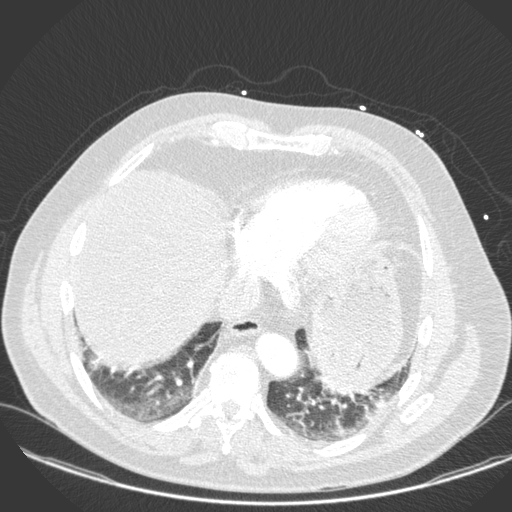
[im 149/322  lung]
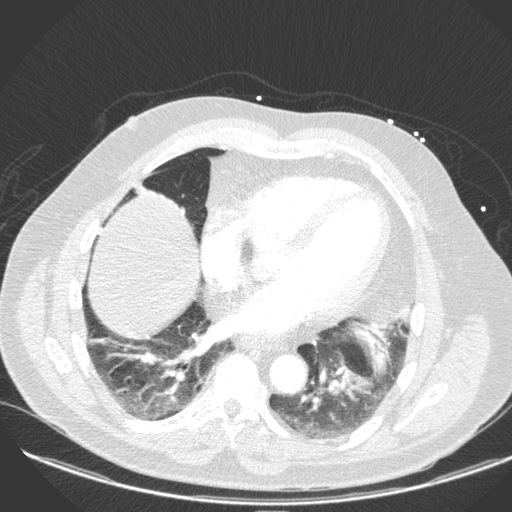
[im 173/322  lung]
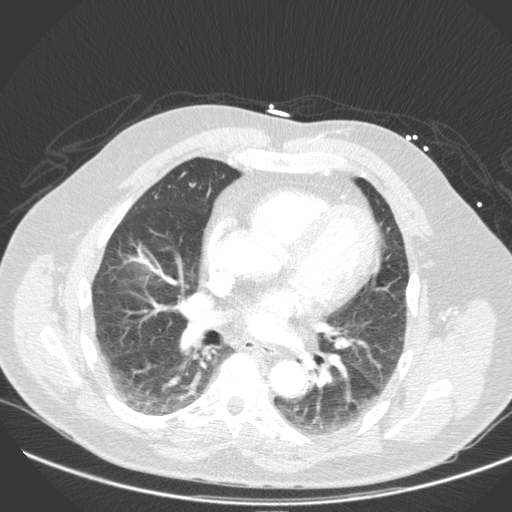
[im 198/322  lung]
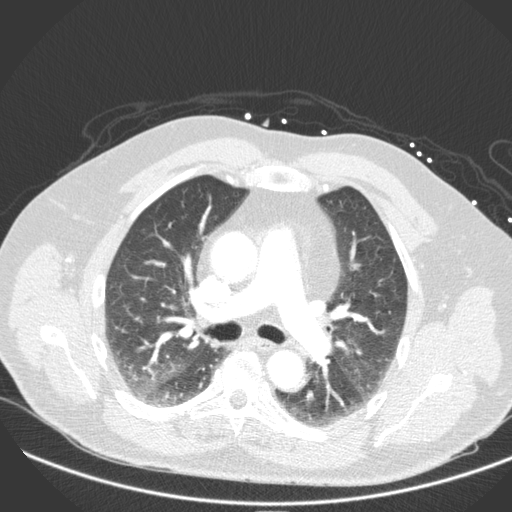
[im 223/322  mediastinal]
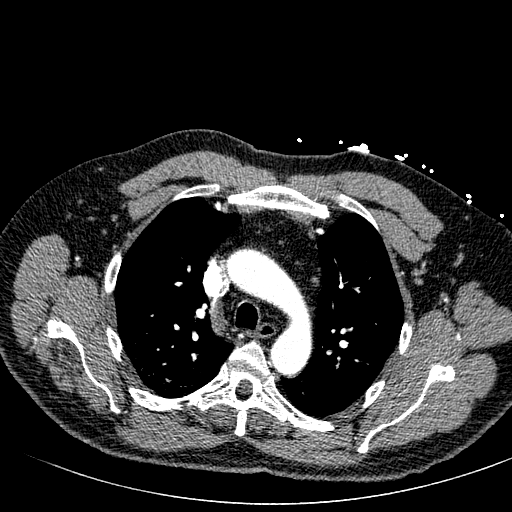
[im 223/322  lung]
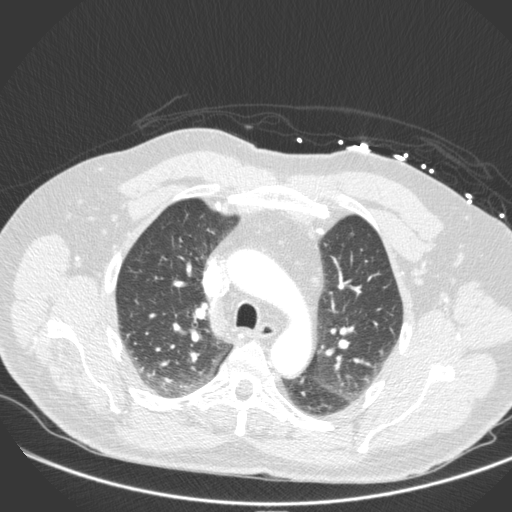
[im 247/322  lung]
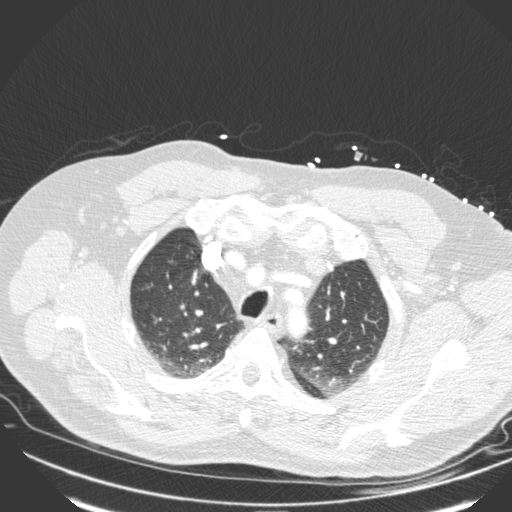
[im 272/322  lung]
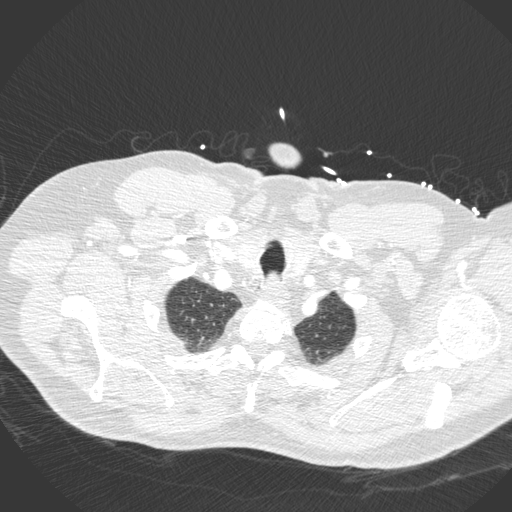
[im 297/322  lung]
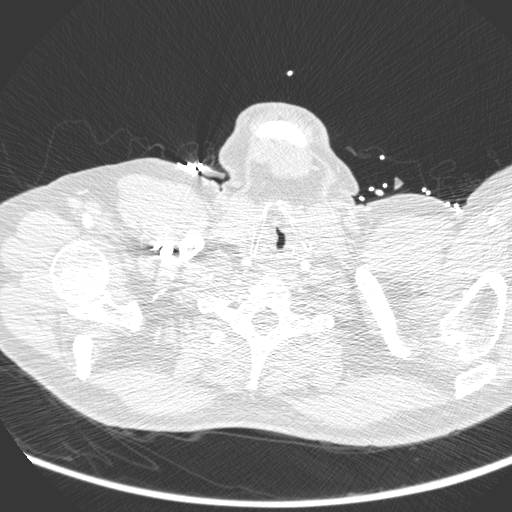

[12 of 37 positions shown; findings below may reference images not displayed]

FINDINGS: CTA CHEST FINDINGS

Mediastinum/Lymph nodes: Chest CTA has some limitation due to motion
artifact. There is no evidence for large or central pulmonary
embolism. Limited evaluation of the aortic root due to extensive
artifact. The great vessels are patent with a bovine type arch.
There is no significant chest lymphadenopathy. No large pericardial
effusion. No significant axillary lymphadenopathy.

Lungs/Pleura: Trachea and mainstem bronchi are patent. Patchy
densities at the left lung base and the lingula are nonspecific but
could represent atelectasis. Patchy densities along the right lower
lobe are also suggestive for volume loss.

Musculoskeletal: No acute bone abnormality.

CT ABDOMEN and PELVIS FINDINGS

Hepatobiliary: Low-attenuation of the liver is suggestive for
hepatic steatosis. No focal liver lesion. No gross abnormality to
the gallbladder. The portal venous system is patent.

Pancreas: There is edema and fat stranding in the region of the
pancreatic neck and body. There is no significant pancreatic duct
dilatation.

Spleen: Spleen has been removed. There are at least 2 small
splenules in the left upper quadrant of the abdomen.

Adrenals/Urinary tract: Normal appearance of the adrenal glands.
Cyst in the right kidney interpolar region. Probable small cyst in
the medial aspect of left kidney. No hydronephrosis. Normal
appearance of the urinary bladder.

Stomach/Bowel: No acute abnormality to the stomach or duodenum.
Scattered colonic diverticula but acute colonic inflammatory
changes. Normal appearance of the appendix.

Vascular/Lymphatic: Negative for an aortic aneurysm. No suspicious
lymphadenopathy in the abdomen or pelvis.

Reproductive: Mild fat stranding in the pelvis near the seminal
vesicles is nonspecific. No gross abnormality to the prostate.

Other: No significant free fluid.  No free air.

Musculoskeletal: No acute bone abnormality. Mild disc space loss at
L5-S1.

Review of the MIP images confirms the above findings.
IMPRESSION: CTA examination of the chest has technical limitations due to motion
artifact. There is no evidence for a large or central pulmonary
embolism.

Patchy densities at the left lung base are most suggestive for
atelectasis.

Peripancreatic edema and fat stranding around the pancreatic neck
and body. Findings are suggestive for pancreatitis. No evidence for
duct dilatation and no evidence for pseudocyst formation.

Hepatic steatosis.

Bilateral renal cysts.

Colonic diverticula without acute inflammation.

Mild stranding or edema near the seminal vesicles is nonspecific.

## 2018-02-02 ENCOUNTER — Ambulatory Visit: Payer: PRIVATE HEALTH INSURANCE | Admitting: Cardiology

## 2018-02-03 ENCOUNTER — Ambulatory Visit (INDEPENDENT_AMBULATORY_CARE_PROVIDER_SITE_OTHER): Payer: PRIVATE HEALTH INSURANCE | Admitting: Cardiology

## 2018-02-03 ENCOUNTER — Encounter: Payer: Self-pay | Admitting: Cardiology

## 2018-02-03 VITALS — BP 120/72 | HR 67 | Ht 72.0 in | Wt 246.0 lb

## 2018-02-03 DIAGNOSIS — I2510 Atherosclerotic heart disease of native coronary artery without angina pectoris: Secondary | ICD-10-CM

## 2018-02-03 DIAGNOSIS — I10 Essential (primary) hypertension: Secondary | ICD-10-CM

## 2018-02-03 MED ORDER — ATORVASTATIN CALCIUM 10 MG PO TABS: 10.0000 mg | ORAL_TABLET | Freq: Every day | ORAL | 1 refills | Status: DC

## 2018-02-03 NOTE — Progress Notes (Signed)
Cardiology Office Note:    Date:  02/03/2018   ID:  AADAM Jose Clay, DOB 07/27/1955, MRN [id number]  PCP:  Cyndi Bender, PA-C  Cardiologist:  Jenean Lindau, MD   Referring MD: Cyndi Bender, PA-C    ASSESSMENT:    1. Coronary artery disease involving native coronary artery of native heart without angina pectoris   2. Essential hypertension    PLAN:    In order of problems listed above:  1. Secondary prevention stressed with the patient.  Importance of compliance with diet and medication stressed and he vocalized understanding.  His blood pressure is stable.  Diet was discussed for dyslipidemia.  For secondary prevention he was initiated on atorvastatin 10 mg daily he will be back in 6 weeks for liver lipid check.  Recent baseline liver function was normal.  Patient was advised to take coated 81 mg of aspirin on a daily basis. 2. Patient will be seen in follow-up appointment in 6 months or earlier if the patient has any concerns.  Diet was emphasized and weight reduction was stressed.  Risks of obesity discussed at length.    Medication Adjustments/Labs and Tests Ordered: Current medicines are reviewed at length with the patient today.  Concerns regarding medicines are outlined above.  Orders Placed This Encounter  Procedures  . Hepatic function panel  . Lipid panel   Meds ordered this encounter  Medications  . atorvastatin (LIPITOR) 10 MG tablet    Sig: Take 1 tablet (10 mg total) by mouth daily.    Dispense:  90 tablet    Refill:  1     No chief complaint on file.    History of Present Illness:    Jose Clay is a 62 y.o. male.  Patient went to the hospital with unstable angina and underwent coronary angiography which revealed 45% mid LAD stenosis.  He denies any problems at this time and takes care of activities of daily living.  No chest pain orthopnea or PND.  He leads a sedentary lifestyle and does not exercise on a regular basis.  His wife accompanies him  for this visit.  Past Medical History:  Diagnosis Date  . Asthma   . GERD (gastroesophageal reflux disease)   . 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 Hypertension   . Myocardial infarction ([place]) 2017  . OSA on CPAP     Past Surgical History:  Procedure Laterality Date  . CARDIAC CATHETERIZATION  01/10/2018  . JOINT REPLACEMENT    . LEFT HEART CATH AND CORONARY ANGIOGRAPHY N/A 01/10/2018   Procedure: LEFT HEART CATH AND CORONARY ANGIOGRAPHY;  Surgeon: Leonie Man, MD;  Location: [facility];  Service: Cardiovascular;  Laterality: N/A;  . NASAL SINUS SURGERY  1990s  . RECONSTRUCTION URETHROPLASTY  2012 X 5   "S/P fell, straddled a beam"  . SHOULDER ARTHROSCOPY W/ ROTATOR CUFF REPAIR Right ~ 2014  . SPLENECTOMY  1988  . TONSILLECTOMY    . TOTAL HIP ARTHROPLASTY Right 07/2015    Current Medications: Current Meds  Medication Sig  . albuterol (VENTOLIN HFA) 108 (90 Base) MCG/ACT inhaler Inhale 2 puffs into the lungs as needed.  Marland Kitchen aspirin EC 81 MG EC tablet Take 1 tablet (81 mg total) by mouth daily.  . budesonide-formoterol (SYMBICORT) 160-4.5 MCG/ACT inhaler Inhale 2 puffs into the lungs 2 (two) times daily.  Marland Kitchen EPINEPHrine (EPIPEN 2-PAK) 0.3 mg/0.3 mL IJ SOAJ injection Self inject per package  instructions as needed for severe allergic reaction and seek medical attention. Pharmacist: Substitution permitted.  Marland Kitchen esomeprazole (NEXIUM) 40 MG capsule Take 80 mg by mouth daily at 12 noon.   . gabapentin (NEURONTIN) 300 MG capsule Take 300 mg by mouth 2 (two) times daily.  Marland Kitchen losartan-hydrochlorothiazide (HYZAAR) 100-12.5 MG tablet Take 1 tablet by mouth daily. for blood pressure     Allergies:   Bee venom and Shellfish allergy   Social History   Socioeconomic History  . Marital status: Married    Spouse name: Not on file  . Number of children: Not on file  . Years of education: Not on file  . Highest education level: Not on file    Occupational History  . Not on file  Social Needs  . Financial resource strain: Not on file  . Food insecurity:    Worry: Not on file    Inability: Not on file  . Transportation needs:    Medical: Not on file    Non-medical: Not on file  Tobacco Use  . Smoking status: Former Smoker    Packs/day: 0.50    Years: 2.00    Pack years: 1.00    Types: Cigarettes    Last attempt to quit: 1981    Years since quitting: 38.9  . Smokeless tobacco: Never Used  Substance and Sexual Activity  . Alcohol use: Yes    Alcohol/week: 14.0 standard drinks    Types: 14 Glasses of wine per week    Comment: 01/10/2018 "red wine"  . Drug use: Never  . Sexual activity: Yes  Lifestyle  . Physical activity:    Days per week: Not on file    Minutes per session: Not on file  . Stress: Not on file  Relationships  . Social connections:    Talks on phone: Not on file    Gets together: Not on file    Attends religious service: Not on file    Active member of club or organization: Not on file    Attends meetings of clubs or organizations: Not on file    Relationship status: Not on file  Other Topics Concern  . Not on file  Social History Narrative  . Not on file     Family History: The patient's family history includes Colon cancer in his father; Diabetes in his mother.  ROS:   Please see the history of present illness.    All other systems reviewed and are negative.  EKGs/Labs/Other Studies Reviewed:    The following studies were reviewed today: I discussed my findings with the patient at extensive length.  Coronary geography report was reviewed and hospital records were reviewed and discussed with the patient.  Next secondary   Recent Labs: 01/11/2018: BUN 12; Creatinine, Ser 1.13; Hemoglobin 15.3; Platelets 387; Potassium 3.8; Sodium 138  Recent Lipid Panel    Component Value Date/Time   CHOL 172 01/10/2018 0929   TRIG 176 (H) 01/10/2018 0929   HDL 39 (L) 01/10/2018 0929   CHOLHDL  4.4 01/10/2018 0929   VLDL 35 01/10/2018 0929   LDLCALC 98 01/10/2018 0929    Physical Exam:    VS:  BP 120/72 (BP Location: Right Arm, Patient Position: Sitting, Cuff Size: Normal)   Pulse 67   Ht 6' (1.829 m)   Wt 246 lb (111.6 kg)   SpO2 96%   BMI 33.36 kg/m     Wt Readings from Last 3 Encounters:  02/03/18 246 lb (111.6 kg)  01/11/18  240 lb 3.2 oz (109 kg)  08/22/15 226 lb 6.4 oz (102.7 kg)     GEN: Patient is in no acute distress HEENT: Normal NECK: No JVD; No carotid bruits LYMPHATICS: No lymphadenopathy CARDIAC: Hear sounds regular, 2/6 systolic murmur at the apex. RESPIRATORY:  Clear to auscultation without rales, wheezing or rhonchi  ABDOMEN: Soft, non-tender, non-distended MUSCULOSKELETAL:  No edema; No deformity  SKIN: Warm and dry NEUROLOGIC:  Alert and oriented x 3 PSYCHIATRIC:  Normal affect   Signed, Jenean Lindau, MD  02/03/2018 9:18 AM    [place]

## 2018-02-03 NOTE — Patient Instructions (Addendum)
Medication Instructions:  Your physician has recommended you make the following change in your medication:   START lipitor 10 mg daily Please start taking 81 mg of enteric coated aspirin daily  If you need a refill on your cardiac medications before your next appointment, please call your pharmacy.   Lab work: Your physician recommends that you have the following labs drawn: liver and lipid panel in 6 weeks.  If you have labs (blood work) drawn today and your tests are completely normal, you will receive your results only by: Marland Kitchen MyChart Message (if you have MyChart) OR . A paper copy in the mail If you have any lab test that is abnormal or we need to change your treatment, we will call you to review the results.  Testing/Procedures: None  Follow-Up: At [facility], you and your health needs are our priority.  As part of our continuing mission to provide you with exceptional heart care, we have created designated Provider Care Teams.  These Care Teams include your primary Cardiologist (physician) and Advanced Practice Providers (APPs -  Physician Assistants and Nurse Practitioners) who all work together to provide you with the care you need, when you need it.  Please return in 6 months to be seen by Dr. Geraldo Pitter  Any Other Special Instructions Will Be Listed Below (If Applicable).

## 2018-03-22 LAB — HEPATIC FUNCTION PANEL
ALK PHOS: 85 IU/L (ref 39–117)
ALT: 31 IU/L (ref 0–44)
AST: 29 IU/L (ref 0–40)
Albumin: 4.3 g/dL (ref 3.8–4.8)
Bilirubin Total: 0.5 mg/dL (ref 0.0–1.2)
Bilirubin, Direct: 0.18 mg/dL (ref 0.00–0.40)
TOTAL PROTEIN: 6.9 g/dL (ref 6.0–8.5)

## 2018-03-22 LAB — LIPID PANEL
CHOLESTEROL TOTAL: 132 mg/dL (ref 100–199)
Chol/HDL Ratio: 3.4 ratio (ref 0.0–5.0)
HDL: 39 mg/dL — ABNORMAL LOW (ref >39)
LDL CALC: 69 mg/dL (ref 0–99)
TRIGLYCERIDES: 119 mg/dL (ref 0–149)
VLDL Cholesterol Cal: 24 mg/dL (ref 5–40)

## 2018-03-23 ENCOUNTER — Telehealth: Payer: Self-pay

## 2018-03-23 NOTE — Telephone Encounter (Signed)
Patient called and notified of lab results. 

## 2018-03-23 NOTE — Telephone Encounter (Signed)
-----   Message from Jenean Lindau, MD sent at 03/22/2018  5:24 PM EST ----- The results of the study is unremarkable. Please inform patient. I will discuss in detail at next appointment. Cc  primary care/referring physician Jenean Lindau, MD 03/22/2018 5:24 PM

## 2018-07-24 ENCOUNTER — Encounter (HOSPITAL_COMMUNITY): Payer: Self-pay | Admitting: *Deleted

## 2018-07-24 ENCOUNTER — Emergency Department (HOSPITAL_COMMUNITY)
Admission: EM | Admit: 2018-07-24 | Discharge: 2018-07-25 | Disposition: A | Discharge status: Home / Self Care | Payer: No Typology Code available for payment source | Attending: Emergency Medicine | Admitting: Emergency Medicine

## 2018-07-24 ENCOUNTER — Emergency Department (HOSPITAL_COMMUNITY): Payer: No Typology Code available for payment source

## 2018-07-24 DIAGNOSIS — Z5321 Procedure and treatment not carried out due to patient leaving prior to being seen by health care provider: Secondary | ICD-10-CM | POA: Diagnosis not present

## 2018-07-24 DIAGNOSIS — R079 Chest pain, unspecified: Secondary | ICD-10-CM | POA: Diagnosis not present

## 2018-07-24 LAB — BASIC METABOLIC PANEL
Anion gap: 14 (ref 5–15)
BUN: 15 mg/dL (ref 8–23)
CO2: 18 mmol/L — ABNORMAL LOW (ref 22–32)
Calcium: 9.5 mg/dL (ref 8.9–10.3)
Chloride: 104 mmol/L (ref 98–111)
Creatinine, Ser: 1.15 mg/dL (ref 0.61–1.24)
GFR calc Af Amer: >60 mL/min (ref >60)
GFR calc non Af Amer: >60 mL/min (ref >60)
Glucose, Bld: 95 mg/dL (ref 70–99)
Potassium: 4.3 mmol/L (ref 3.5–5.1)
Sodium: 136 mmol/L (ref 135–145)

## 2018-07-24 LAB — CBC
HCT: 46.1 % (ref 39.0–52.0)
Hemoglobin: 16 g/dL (ref 13.0–17.0)
MCH: 31.9 pg (ref 26.0–34.0)
MCHC: 34.7 g/dL (ref 30.0–36.0)
MCV: 91.8 fL (ref 80.0–100.0)
Platelets: 421 10*3/uL — ABNORMAL HIGH (ref 150–400)
RBC: 5.02 MIL/uL (ref 4.22–5.81)
RDW: 14.1 % (ref 11.5–15.5)
WBC: 7 10*3/uL (ref 4.0–10.5)
nRBC: 0 % (ref 0.0–0.2)

## 2018-07-24 LAB — TROPONIN I: Troponin I: <0.03 ng/mL (ref <0.03)

## 2018-07-24 MED ORDER — SODIUM CHLORIDE 0.9% FLUSH: 3.0000 mL | Freq: Once | INTRAVENOUS | Status: DC

## 2018-07-24 NOTE — ED Triage Notes (Signed)
Pt in c/o chest pain, leg swelling and shortness of breath that started this morning, sent here by PCP

## 2018-07-24 NOTE — ED Notes (Signed)
Pt c/o CP; taken to Triage for repeat EKG

## 2018-07-25 NOTE — ED Notes (Signed)
Pt stated he was leaving. 

## 2018-07-27 ENCOUNTER — Other Ambulatory Visit: Payer: Self-pay | Admitting: Cardiology

## 2018-08-01 ENCOUNTER — Encounter: Payer: Self-pay | Admitting: Cardiology

## 2018-08-01 ENCOUNTER — Telehealth: Payer: Self-pay | Admitting: Cardiology

## 2018-08-01 ENCOUNTER — Telehealth (INDEPENDENT_AMBULATORY_CARE_PROVIDER_SITE_OTHER): Payer: No Typology Code available for payment source | Admitting: Cardiology

## 2018-08-01 ENCOUNTER — Other Ambulatory Visit: Payer: Self-pay

## 2018-08-01 VITALS — Ht 73.0 in | Wt 245.0 lb

## 2018-08-01 DIAGNOSIS — I2510 Atherosclerotic heart disease of native coronary artery without angina pectoris: Secondary | ICD-10-CM | POA: Diagnosis not present

## 2018-08-01 DIAGNOSIS — I10 Essential (primary) hypertension: Secondary | ICD-10-CM

## 2018-08-01 DIAGNOSIS — G4733 Obstructive sleep apnea (adult) (pediatric): Secondary | ICD-10-CM

## 2018-08-01 MED ORDER — NITROGLYCERIN 0.4 MG SL SUBL: 0.4000 mg | SUBLINGUAL_TABLET | SUBLINGUAL | 3 refills | Status: DC | PRN

## 2018-08-01 NOTE — Progress Notes (Signed)
Virtual Visit via Video Note   This visit type was conducted due to national recommendations for restrictions regarding the COVID-19 Pandemic (e.g. social distancing) in an effort to limit this patient's exposure and mitigate transmission in our community.  Due to his co-morbid illnesses, this patient is at least at moderate risk for complications without adequate follow up.  This format is felt to be most appropriate for this patient at this time.  All issues noted in this document were discussed and addressed.  A limited physical exam was performed with this format.  Please refer to the patient's chart for his consent to telehealth for [facility].   Date:  08/01/2018   ID:  Jose Clay, DOB 03-01-1955, MRN [id number]  Patient Location: Home Provider Location: Home  PCP:  Cyndi Bender, PA-C  Cardiologist:  No primary care provider on file.  Electrophysiologist:  None   Evaluation Performed:  Follow-Up Visit  Chief Complaint: Coronary artery disease  History of Present Illness:    Jose Clay is a 63 y.o. male with past medical history of essential hypertension, coronary artery disease.  Patient went to the emergency room recently with chest discomfort.  He mentions to me that he most likely was lifting a lot of heavy stuff and had chest discomfort.  He was evaluated and discharged.  Subsequently is done fine and has ambulated well.  At the time of my evaluation, the patient is alert awake oriented and in no distress.  The patient does not have symptoms concerning for COVID-19 infection (fever, chills, cough, or new shortness of breath).    Past Medical History:  Diagnosis Date  . Asthma   . GERD (gastroesophageal reflux disease)   . 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 Hypertension   . Myocardial infarction ([place]) 2017  . OSA on CPAP    Past Surgical History:  Procedure Laterality Date  . CARDIAC CATHETERIZATION  01/10/2018  . JOINT  REPLACEMENT    . LEFT HEART CATH AND CORONARY ANGIOGRAPHY N/A 01/10/2018   Procedure: LEFT HEART CATH AND CORONARY ANGIOGRAPHY;  Surgeon: Leonie Man, MD;  Location: [facility];  Service: Cardiovascular;  Laterality: N/A;  . NASAL SINUS SURGERY  1990s  . RECONSTRUCTION URETHROPLASTY  2012 X 5   "S/P fell, straddled a beam"  . SHOULDER ARTHROSCOPY W/ ROTATOR CUFF REPAIR Right ~ 2014  . SPLENECTOMY  1988  . TONSILLECTOMY    . TOTAL HIP ARTHROPLASTY Right 07/2015     Current Meds  Medication Sig  . albuterol (VENTOLIN HFA) 108 (90 Base) MCG/ACT inhaler Inhale 2 puffs into the lungs as needed.  Marland Kitchen aspirin EC 81 MG EC tablet Take 1 tablet (81 mg total) by mouth daily.  Marland Kitchen atorvastatin (LIPITOR) 10 MG tablet Take 1 tablet (10 mg total) by mouth daily.  . budesonide-formoterol (SYMBICORT) 160-4.5 MCG/ACT inhaler Inhale 2 puffs into the lungs 2 (two) times daily.  Marland Kitchen EPINEPHrine (EPIPEN 2-PAK) 0.3 mg/0.3 mL IJ SOAJ injection Self inject per package instructions as needed for severe allergic reaction and seek medical attention. Pharmacist: Substitution permitted.  Marland Kitchen esomeprazole (NEXIUM) 40 MG capsule Take 80 mg by mouth daily at 12 noon.   . gabapentin (NEURONTIN) 600 MG tablet Take 600 mg by mouth 2 (two) times daily.   Marland Kitchen losartan-hydrochlorothiazide (HYZAAR) 100-12.5 MG tablet Take 1 tablet by mouth daily. for blood pressure  . meloxicam (MOBIC) 7.5 MG tablet Take 7.5 mg by  mouth daily.     Allergies:   Bee venom and Shellfish allergy   Social History   Tobacco Use  . Smoking status: Former Smoker    Packs/day: 0.50    Years: 2.00    Pack years: 1.00    Types: Cigarettes    Quit date: 1981    Years since quitting: 39.4  . Smokeless tobacco: Never Used  Substance Use Topics  . Alcohol use: Yes    Alcohol/week: 14.0 standard drinks    Types: 14 Glasses of wine per week    Comment: 01/10/2018 "red wine"  . Drug use: Never     Family Hx: The patient's family history  includes Colon cancer in his father; Diabetes in his mother.  ROS:   Please see the history of present illness.    I discussed my findings.  Review of systems is as above All other systems reviewed and are negative.   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  The left ventricular systolic function is normal. The left ventricular ejection fraction is 55-65% by visual estimate.  LV end diastolic pressure is normal.   SUMMARY:  Angiographically minimal CAD with exception of mild mid-distal LAD myocardial bridging.  Normal LV size and function  RECOMMENDATIONS:  Evaluate for noncardiac cause for chest pain.  Recommend Aspirin 81mg  daily for moderate CAD.      Labs/Other Tests and Data Reviewed:    EKG: EKG from June 8 reveals sinus rhythm right bundle branch block and nonspecific ST-T changes.  Recent Labs: 03/22/2018: ALT 31 07/24/2018: BUN 15; Creatinine, Ser 1.15; Hemoglobin 16.0; Platelets 421; Potassium 4.3; Sodium 136   Recent Lipid Panel Lab Results  Component Value Date/Time   CHOL 132 03/22/2018 10:33 AM   TRIG 119 03/22/2018 10:33 AM   HDL 39 (L) 03/22/2018 10:33 AM   CHOLHDL 3.4 03/22/2018 10:33 AM   CHOLHDL 4.4 01/10/2018 09:29 AM   LDLCALC 69 03/22/2018 10:33 AM    Wt Readings from Last 3 Encounters:  08/01/18 245 lb (111.1 kg)  02/03/18 246 lb (111.6 kg)  01/11/18 240 lb 3.2 oz (109 kg)     Objective:    Vital Signs:  Ht 6\' 1"  (1.854 m)   Wt 245 lb (111.1 kg)   BMI 32.32 kg/m    VITAL SIGNS:  reviewed  ASSESSMENT & PLAN:    1. Coronary artery disease: Secondary prevention stressed with the patient.  Importance of compliance with diet and medication stressed and he vocalized understanding.  His blood pressure is stable.  Diet was discussed for dyslipidemia and he vocalized  understanding.  Importance of regular exercise stressed half an hour a day at least 5 days a week 2. I reviewed records from emergency room visit.  These were reviewed extensively.  His wife had multiple questions which were answered to her satisfaction. 3. Patient will be seen in follow-up appointment in 3  months or earlier if the patient has any concerns   COVID-19 Education: The signs and symptoms of COVID-19 were discussed with the patient and how to seek care for testing (follow up with PCP or arrange E-visit).  The importance of social distancing was discussed today.  Time:   Today, I have spent 15 minutes with the patient with telehealth technology discussing the above problems.     Medication  Adjustments/Labs and Tests Ordered: Current medicines are reviewed at length with the patient today.  Concerns regarding medicines are outlined above.   Tests Ordered: No orders of the defined types were placed in this encounter.   Medication Changes: No orders of the defined types were placed in this encounter.   Follow Up:  In Person in 3 month(s)  Signed, Jenean Lindau, MD  08/01/2018 3:04 PM    [facility]

## 2018-08-01 NOTE — Patient Instructions (Addendum)
Medication Instructions:  Your physician has recommended you make the following change in your medication:  START taking nitroglycerin as needed for chest pain When having chest pain, stop what you are doing and sit down. Take 1 nitro, wait 5 minutes. Still having chest pain, take 1 nitro, wait 5 minutes. Still having chest pain, take 1 nitro, dial 911. Total of 3 nitro in 15 minutes.  If you need a refill on your cardiac medications before your next appointment, please call your pharmacy.   Lab work: NONE If you have labs (blood work) drawn today and your tests are completely normal, you will receive your results only by: Marland Kitchen MyChart Message (if you have MyChart) OR . A paper copy in the mail If you have any lab test that is abnormal or we need to change your treatment, we will call you to review the results.  Testing/Procedures: NONE  Follow-Up: At [facility], you and your health needs are our priority.  As part of our continuing mission to provide you with exceptional heart care, we have created designated Provider Care Teams.  These Care Teams include your primary Cardiologist (physician) and Advanced Practice Providers (APPs -  Physician Assistants and Nurse Practitioners) who all work together to provide you with the care you need, when you need it. You will need a follow up appointment in 3 months.     Nitroglycerin sublingual tablets What is this medicine? NITROGLYCERIN (nye troe GLI ser in) is a type of vasodilator. It relaxes blood vessels, increasing the blood and oxygen supply to your heart. This medicine is used to relieve chest pain caused by angina. It is also used to prevent chest pain before activities like climbing stairs, going outdoors in cold weather, or sexual activity. This medicine may be used for other purposes; ask your health care provider or pharmacist if you have questions. COMMON BRAND NAME(S): Nitroquick, Nitrostat, Nitrotab What should I tell my health  care provider before I take this medicine? They need to know if you have any of these conditions: -anemia -head injury, recent stroke, or bleeding in the brain -liver disease -previous heart attack -an unusual or allergic reaction to nitroglycerin, other medicines, foods, dyes, or preservatives -pregnant or trying to get pregnant -breast-feeding How should I use this medicine? Take this medicine by mouth as needed. At the first sign of an angina attack (chest pain or tightness) place one tablet under your tongue. You can also take this medicine 5 to 10 minutes before an event likely to produce chest pain. Follow the directions on the prescription label. Let the tablet dissolve under the tongue. Do not swallow whole. Replace the dose if you accidentally swallow it. It will help if your mouth is not dry. Saliva around the tablet will help it to dissolve more quickly. Do not eat or drink, smoke or chew tobacco while a tablet is dissolving. If you are not better within 5 minutes after taking ONE dose of nitroglycerin, call 9-1-1 immediately to seek emergency medical care. Do not take more than 3 nitroglycerin tablets over 15 minutes. If you take this medicine often to relieve symptoms of angina, your doctor or health care professional may provide you with different instructions to manage your symptoms. If symptoms do not go away after following these instructions, it is important to call 9-1-1 immediately. Do not take more than 3 nitroglycerin tablets over 15 minutes. Talk to your pediatrician regarding the use of this medicine in children. Special care may be  needed. Overdosage: If you think you have taken too much of this medicine contact a poison control center or emergency room at once. NOTE: This medicine is only for you. Do not share this medicine with others. What if I miss a dose? This does not apply. This medicine is only used as needed. What may interact with this medicine? Do not take this  medicine with any of the following medications: -certain migraine medicines like ergotamine and dihydroergotamine (DHE) -medicines used to treat erectile dysfunction like sildenafil, tadalafil, and vardenafil -riociguat This medicine may also interact with the following medications: -alteplase -aspirin -heparin -medicines for high blood pressure -medicines for mental depression -other medicines used to treat angina -phenothiazines like chlorpromazine, mesoridazine, prochlorperazine, thioridazine This list may not describe all possible interactions. Give your health care provider a list of all the medicines, herbs, non-prescription drugs, or dietary supplements you use. Also tell them if you smoke, drink alcohol, or use illegal drugs. Some items may interact with your medicine. What should I watch for while using this medicine? Tell your doctor or health care professional if you feel your medicine is no longer working. Keep this medicine with you at all times. Sit or lie down when you take your medicine to prevent falling if you feel dizzy or faint after using it. Try to remain calm. This will help you to feel better faster. If you feel dizzy, take several deep breaths and lie down with your feet propped up, or bend forward with your head resting between your knees. You may get drowsy or dizzy. Do not drive, use machinery, or do anything that needs mental alertness until you know how this drug affects you. Do not stand or sit up quickly, especially if you are an older patient. This reduces the risk of dizzy or fainting spells. Alcohol can make you more drowsy and dizzy. Avoid alcoholic drinks. Do not treat yourself for coughs, colds, or pain while you are taking this medicine without asking your doctor or health care professional for advice. Some ingredients may increase your blood pressure. What side effects may I notice from receiving this medicine? Side effects that you should report to your  doctor or health care professional as soon as possible: -blurred vision -dry mouth -skin rash -sweating -the feeling of extreme pressure in the head -unusually weak or tired Side effects that usually do not require medical attention (report to your doctor or health care professional if they continue or are bothersome): -flushing of the face or neck -headache -irregular heartbeat, palpitations -nausea, vomiting This list may not describe all possible side effects. Call your doctor for medical advice about side effects. You may report side effects to FDA at 1-800-FDA-1088. Where should I keep my medicine? Keep out of the reach of children. Store at room temperature between 20 and 25 degrees C (68 and 77 degrees F). Store in [facility] [employer]. Protect from light and moisture. Keep tightly closed. Throw away any unused medicine after the expiration date. NOTE: This sheet is a summary. It may not cover all possible information. If you have questions about this medicine, talk to your doctor, pharmacist, or health care provider.  2019 Elsevier/Gold Standard (2012-11-30 17:57:36)

## 2018-08-01 NOTE — Telephone Encounter (Signed)
Virtual Visit Pre-Appointment Phone Call  "(Name), I am calling you today to discuss your upcoming appointment. We are currently trying to limit exposure to the virus that causes COVID-19 by seeing patients at home rather than in the office."  1. "What is the BEST phone number to call the day of the visit?" - include this in appointment notes  2. Do you have or have access to (through a family member/friend) a smartphone with video capability that we can use for your visit?" a. If yes - list this number in appt notes as cell (if different from BEST phone #) and list the appointment type as a VIDEO visit in appointment notes b. If no - list the appointment type as a PHONE visit in appointment notes  3. Confirm consent - "In the setting of the current Covid19 crisis, you are scheduled for a (phone or video) visit with your provider on (date) at (time).  Just as we do with many in-office visits, in order for you to participate in this visit, we must obtain consent.  If you'd like, I can send this to your mychart (if signed up) or email for you to review.  Otherwise, I can obtain your verbal consent now.  All virtual visits are billed to your insurance company just like a normal visit would be.  By agreeing to a virtual visit, we'd like you to understand that the technology does not allow for your provider to perform an examination, and thus may limit your provider's ability to fully assess your condition. If your provider identifies any concerns that need to be evaluated in person, we will make arrangements to do so.  Finally, though the technology is pretty good, we cannot assure that it will always work on either your or our end, and in the setting of a video visit, we may have to convert it to a phone-only visit.  In either situation, we cannot ensure that we have a secure connection.  Are you willing to proceed?" STAFF: Did the patient verbally acknowledge consent to telehealth visit? Document  YES/NO here: Yes per spouse  4. Advise patient to be prepared - "Two hours prior to your appointment, go ahead and check your blood pressure, pulse, oxygen saturation, and your weight (if you have the equipment to check those) and write them all down. When your visit starts, your provider will ask you for this information. If you have an Apple Watch or Kardia device, please plan to have heart rate information ready on the day of your appointment. Please have a pen and paper handy nearby the day of the visit as well."  5. Give patient instructions for MyChart download to smartphone OR [email or web address] as below if video visit (depending on what platform provider is using)  6. Inform patient they will receive a phone call 15 minutes prior to their appointment time (may be from unknown caller ID) so they should be prepared to answer    Jose Clay has been deemed a candidate for a follow-up tele-health visit to limit community exposure during the Covid-19 pandemic. I spoke with the patient via phone to ensure availability of phone/video source, confirm preferred email & phone number, and discuss instructions and expectations.  I reminded Jose Clay to be prepared with any vital sign and/or heart rhythm information that could potentially be obtained via home monitoring, at the time of his visit. I reminded Jose Clay to expect a phone call  prior to his visit.  Jose Clay 08/01/2018 9:34 AM   INSTRUCTIONS FOR DOWNLOADING THE MYCHART APP TO SMARTPHONE  - The patient must first make sure to have activated MyChart and know their login information - If Apple, go to [employer] and type in MyChart in the search bar and download the app. If Android, ask patient to go to [employer] and type in [place] in the search bar and download the app. The app is free but as with any other app downloads, their phone may require them to verify saved payment information or  Apple/Android password.  - The patient will need to then log into the app with their MyChart username and password, and select [facility] as their healthcare provider to link the account. When it is time for your visit, go to the MyChart app, find appointments, and click Begin Video Visit. Be sure to Select Allow for your device to access the Microphone and Camera for your visit. You will then be connected, and your provider will be with you shortly.  **If they have any issues connecting, or need assistance please contact MyChart service desk [phone])**  **If using a computer, in order to ensure the best quality for their visit they will need to use either of the following Internet Browsers: [employer], or Google Chrome**  IF USING DOXIMITY or DOXY.ME - The patient will receive a link just prior to their visit by text.     FULL LENGTH CONSENT FOR TELE-HEALTH VISIT   I hereby voluntarily request, consent and authorize [facility] and its employed or contracted physicians, physician assistants, nurse practitioners or other licensed health care professionals (the Practitioner), to provide me with telemedicine health care services (the Services") as deemed necessary by the treating Practitioner. I acknowledge and consent to receive the Services by the Practitioner via telemedicine. I understand that the telemedicine visit will involve communicating with the Practitioner through live audiovisual communication technology and the disclosure of certain medical information by electronic transmission. I acknowledge that I have been given the opportunity to request an in-person assessment or other available alternative prior to the telemedicine visit and am voluntarily participating in the telemedicine visit.  I understand that I have the right to withhold or withdraw my consent to the use of telemedicine in the course of my care at any time, without affecting my right to future care  or treatment, and that the Practitioner or I may terminate the telemedicine visit at any time. I understand that I have the right to inspect all information obtained and/or recorded in the course of the telemedicine visit and may receive copies of available information for a reasonable fee.  I understand that some of the potential risks of receiving the Services via telemedicine include:   Delay or interruption in medical evaluation due to technological equipment failure or disruption;  Information transmitted may not be sufficient (e.g. poor resolution of images) to allow for appropriate medical decision making by the Practitioner; and/or   In rare instances, security protocols could fail, causing a breach of personal health information.  Furthermore, I acknowledge that it is my responsibility to provide information about my medical history, conditions and care that is complete and accurate to the best of my ability. I acknowledge that Practitioner's advice, recommendations, and/or decision may be based on factors not within their control, such as incomplete or inaccurate data provided by me or distortions of diagnostic images or specimens that may result from electronic  transmissions. I understand that the practice of medicine is not an exact science and that Practitioner makes no warranties or guarantees regarding treatment outcomes. I acknowledge that I will receive a copy of this consent concurrently upon execution via email to the email address I last provided but may also request a printed copy by calling the office of [facility].    I understand that my insurance will be billed for this visit.   I have read or had this consent read to me.  I understand the contents of this consent, which adequately explains the benefits and risks of the Services being provided via telemedicine.   I have been provided ample opportunity to ask questions regarding this consent and the Services and have had  my questions answered to my satisfaction.  I give my informed consent for the services to be provided through the use of telemedicine in my medical care  By participating in this telemedicine visit I agree to the above.

## 2018-08-01 NOTE — Addendum Note (Signed)
Addended by: Beckey Rutter on: 08/01/2018 03:35 PM   Modules accepted: Orders

## 2018-08-26 ENCOUNTER — Other Ambulatory Visit: Payer: Self-pay | Admitting: Cardiology

## 2018-11-01 ENCOUNTER — Other Ambulatory Visit: Payer: Self-pay

## 2018-11-01 ENCOUNTER — Encounter: Payer: Self-pay | Admitting: Cardiology

## 2018-11-01 ENCOUNTER — Telehealth (INDEPENDENT_AMBULATORY_CARE_PROVIDER_SITE_OTHER): Payer: No Typology Code available for payment source | Admitting: Cardiology

## 2018-11-01 ENCOUNTER — Telehealth: Payer: Self-pay

## 2018-11-01 VITALS — BP 128/84 | Ht 73.0 in | Wt 234.0 lb

## 2018-11-01 DIAGNOSIS — I2510 Atherosclerotic heart disease of native coronary artery without angina pectoris: Secondary | ICD-10-CM

## 2018-11-01 DIAGNOSIS — I10 Essential (primary) hypertension: Secondary | ICD-10-CM

## 2018-11-01 DIAGNOSIS — G4733 Obstructive sleep apnea (adult) (pediatric): Secondary | ICD-10-CM

## 2018-11-01 HISTORY — DX: Atherosclerotic heart disease of native coronary artery without angina pectoris: I25.10

## 2018-11-01 NOTE — Progress Notes (Signed)
Virtual Visit via Video Note   This visit type was conducted due to national recommendations for restrictions regarding the COVID-19 Pandemic (e.g. social distancing) in an effort to limit this patient's exposure and mitigate transmission in our community.  Due to his co-morbid illnesses, this patient is at least at moderate risk for complications without adequate follow up.  This format is felt to be most appropriate for this patient at this time.  All issues noted in this document were discussed and addressed.  A limited physical exam was performed with this format.  Please refer to the patient's chart for his consent to telehealth for [facility].   Date:  11/01/2018   ID:  Jose Clay, DOB 1955/05/15, MRN [id number]  Patient Location: Home Provider Location: Home  PCP:  Cyndi Bender, PA-C  Cardiologist:  No primary care provider on file.  Electrophysiologist:  None   Evaluation Performed:  Follow-Up Visit  Chief Complaint: Coronary disease  History of Present Illness:    Jose Clay is a 63 y.o. male with past medical history of coronary artery disease, essential hypertension and dyslipidemia.  He denies any problems at this time and takes care of activities of daily living.  He walks some on a regular basis because of orthopedic issues he is walking is limited.  No chest pain orthopnea or PND.  At the time of my evaluation, the patient is alert awake oriented and in no distress.  The patient does not have symptoms concerning for COVID-19 infection (fever, chills, cough, or new shortness of breath).    Past Medical History:  Diagnosis Date  . Asthma   . GERD (gastroesophageal reflux disease)   . 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 Hypertension   . Myocardial infarction ([place]) 2017  . OSA on CPAP    Past Surgical History:  Procedure Laterality Date  . CARDIAC CATHETERIZATION  01/10/2018  . JOINT REPLACEMENT    . LEFT HEART CATH AND  CORONARY ANGIOGRAPHY N/A 01/10/2018   Procedure: LEFT HEART CATH AND CORONARY ANGIOGRAPHY;  Surgeon: Leonie Man, MD;  Location: [facility];  Service: Cardiovascular;  Laterality: N/A;  . NASAL SINUS SURGERY  1990s  . RECONSTRUCTION URETHROPLASTY  2012 X 5   "S/P fell, straddled a beam"  . SHOULDER ARTHROSCOPY W/ ROTATOR CUFF REPAIR Right ~ 2014  . SPLENECTOMY  1988  . TONSILLECTOMY    . TOTAL HIP ARTHROPLASTY Right 07/2015     Current Meds  Medication Sig  . albuterol (VENTOLIN HFA) 108 (90 Base) MCG/ACT inhaler Inhale 2 puffs into the lungs as needed.  Marland Kitchen aspirin EC 81 MG EC tablet Take 1 tablet (81 mg total) by mouth daily.  Marland Kitchen atorvastatin (LIPITOR) 10 MG tablet TAKE 1 TABLET BY MOUTH EVERY DAY  . budesonide-formoterol (SYMBICORT) 160-4.5 MCG/ACT inhaler Inhale 2 puffs into the lungs 2 (two) times daily.  . cetirizine (ZYRTEC) 10 MG tablet Take 10 mg by mouth daily.  Marland Kitchen EPINEPHrine (EPIPEN 2-PAK) 0.3 mg/0.3 mL IJ SOAJ injection Self inject per package instructions as needed for severe allergic reaction and seek medical attention. Pharmacist: Substitution permitted.  Marland Kitchen esomeprazole (NEXIUM) 40 MG capsule Take 80 mg by mouth daily at 12 noon.   . gabapentin (NEURONTIN) 600 MG tablet Take 600 mg by mouth 2 (two) times daily.   Marland Kitchen losartan-hydrochlorothiazide (HYZAAR) 100-12.5 MG tablet Take 1 tablet by mouth daily. for blood pressure  . meloxicam (MOBIC) 7.5  MG tablet Take 7.5 mg by mouth daily.  . nitroGLYCERIN (NITROSTAT) 0.4 MG SL tablet Place 1 tablet (0.4 mg total) under the tongue every 5 (five) minutes as needed.     Allergies:   Bee venom and Shellfish allergy   Social History   Tobacco Use  . Smoking status: Former Smoker    Packs/day: 0.50    Years: 2.00    Pack years: 1.00    Types: Cigarettes    Quit date: 1981    Years since quitting: 39.7  . Smokeless tobacco: Never Used  Substance Use Topics  . Alcohol use: Yes    Alcohol/week: 14.0 standard drinks     Types: 14 Glasses of wine per week    Comment: 01/10/2018 "red wine"  . Drug use: Never     Family Hx: The patient's family history includes Colon cancer in his father; Diabetes in his mother.  ROS:   Please see the history of present illness.    As mentioned above All other systems reviewed and are negative.   Prior CV studies:   The following studies were reviewed today:  Previous coronary angiography report was discussed with the patient  Labs/Other Tests and Data Reviewed:    EKG:  EKG from previous visits were reviewed  Recent Labs: 03/22/2018: ALT 31 07/24/2018: BUN 15; Creatinine, Ser 1.15; Hemoglobin 16.0; Platelets 421; Potassium 4.3; Sodium 136   Recent Lipid Panel Lab Results  Component Value Date/Time   CHOL 132 03/22/2018 10:33 AM   TRIG 119 03/22/2018 10:33 AM   HDL 39 (L) 03/22/2018 10:33 AM   CHOLHDL 3.4 03/22/2018 10:33 AM   CHOLHDL 4.4 01/10/2018 09:29 AM   LDLCALC 69 03/22/2018 10:33 AM    Wt Readings from Last 3 Encounters:  11/01/18 234 lb (106.1 kg)  08/01/18 245 lb (111.1 kg)  02/03/18 246 lb (111.6 kg)     Objective:    Vital Signs:  BP 128/84   Ht 6\' 1"  (1.854 m)   Wt 234 lb (106.1 kg)   BMI 30.87 kg/m    VITAL SIGNS:  reviewed  ASSESSMENT & PLAN:    1. Coronary artery disease: Nonobstructive in nature.  Asymptomatic.  Secondary prevention stressed with the patient.  Importance of compliance with diet and medication stressed and he vocalized understanding.  His blood pressure is stable. Essential hypertension: Blood pressure stable Mixed dyslipidemia: Diet was discussed for dyslipidemia and he vocalized understanding.  I told him to do the best he can and ambulation Patient will be seen in follow-up appointment in 6 months or earlier if the patient has any concerns.  He mentions to me that his primary care physician did his blood work recently and he was told it was fine.  COVID-19 Education: The signs and symptoms of COVID-19 were  discussed with the patient and how to seek care for testing (follow up with PCP or arrange E-visit).  The importance of social distancing was discussed today.  Time:   Today, I have spent 15 minutes with the patient with telehealth technology discussing the above problems.     Medication Adjustments/Labs and Tests Ordered: Current medicines are reviewed at length with the patient today.  Concerns regarding medicines are outlined above.   Tests Ordered: No orders of the defined types were placed in this encounter.   Medication Changes: No orders of the defined types were placed in this encounter.   Follow Up:  Virtual Visit or In Person in 6 month(s)  Signed, Sunny Schlein  Reuel Derby, MD  11/01/2018 3:31 PM    [facility]

## 2018-11-01 NOTE — Telephone Encounter (Signed)
Set text message for patient to call office. Dr. Docia Furl wanted to try to do virtual visit early.

## 2019-02-28 DIAGNOSIS — G4733 Obstructive sleep apnea (adult) (pediatric): Secondary | ICD-10-CM | POA: Diagnosis not present

## 2019-02-28 DIAGNOSIS — R5383 Other fatigue: Secondary | ICD-10-CM | POA: Diagnosis not present

## 2019-02-28 DIAGNOSIS — J4540 Moderate persistent asthma, uncomplicated: Secondary | ICD-10-CM | POA: Diagnosis not present

## 2019-02-28 DIAGNOSIS — E662 Morbid (severe) obesity with alveolar hypoventilation: Secondary | ICD-10-CM | POA: Diagnosis not present

## 2019-02-28 DIAGNOSIS — J301 Allergic rhinitis due to pollen: Secondary | ICD-10-CM | POA: Diagnosis not present

## 2019-03-07 DIAGNOSIS — R0600 Dyspnea, unspecified: Secondary | ICD-10-CM | POA: Diagnosis not present

## 2019-03-07 DIAGNOSIS — E559 Vitamin D deficiency, unspecified: Secondary | ICD-10-CM | POA: Diagnosis not present

## 2019-03-14 ENCOUNTER — Emergency Department (HOSPITAL_COMMUNITY)
Admission: EM | Admit: 2019-03-14 | Discharge: 2019-03-14 | Disposition: A | Discharge status: Home / Self Care | Payer: BC Managed Care – PPO | Attending: Emergency Medicine | Admitting: Emergency Medicine

## 2019-03-14 ENCOUNTER — Other Ambulatory Visit: Payer: Self-pay

## 2019-03-14 ENCOUNTER — Emergency Department (HOSPITAL_COMMUNITY): Payer: BC Managed Care – PPO

## 2019-03-14 ENCOUNTER — Encounter (HOSPITAL_COMMUNITY): Payer: Self-pay | Admitting: Emergency Medicine

## 2019-03-14 DIAGNOSIS — I491 Atrial premature depolarization: Secondary | ICD-10-CM | POA: Diagnosis not present

## 2019-03-14 DIAGNOSIS — I10 Essential (primary) hypertension: Secondary | ICD-10-CM | POA: Diagnosis not present

## 2019-03-14 DIAGNOSIS — Z96641 Presence of right artificial hip joint: Secondary | ICD-10-CM | POA: Insufficient documentation

## 2019-03-14 DIAGNOSIS — I4510 Unspecified right bundle-branch block: Secondary | ICD-10-CM | POA: Diagnosis not present

## 2019-03-14 DIAGNOSIS — D75839 Thrombocytosis, unspecified: Secondary | ICD-10-CM

## 2019-03-14 DIAGNOSIS — D473 Essential (hemorrhagic) thrombocythemia: Secondary | ICD-10-CM | POA: Diagnosis not present

## 2019-03-14 DIAGNOSIS — Z87891 Personal history of nicotine dependence: Secondary | ICD-10-CM | POA: Diagnosis not present

## 2019-03-14 DIAGNOSIS — Z79899 Other long term (current) drug therapy: Secondary | ICD-10-CM | POA: Diagnosis not present

## 2019-03-14 DIAGNOSIS — R0600 Dyspnea, unspecified: Secondary | ICD-10-CM | POA: Diagnosis not present

## 2019-03-14 DIAGNOSIS — I2510 Atherosclerotic heart disease of native coronary artery without angina pectoris: Secondary | ICD-10-CM | POA: Diagnosis not present

## 2019-03-14 DIAGNOSIS — R0789 Other chest pain: Secondary | ICD-10-CM | POA: Diagnosis not present

## 2019-03-14 DIAGNOSIS — J45909 Unspecified asthma, uncomplicated: Secondary | ICD-10-CM | POA: Diagnosis not present

## 2019-03-14 DIAGNOSIS — Z7982 Long term (current) use of aspirin: Secondary | ICD-10-CM | POA: Insufficient documentation

## 2019-03-14 DIAGNOSIS — R079 Chest pain, unspecified: Secondary | ICD-10-CM | POA: Diagnosis not present

## 2019-03-14 LAB — CBC
HCT: 45.7 % (ref 39.0–52.0)
Hemoglobin: 15.4 g/dL (ref 13.0–17.0)
MCH: 32.1 pg (ref 26.0–34.0)
MCHC: 33.7 g/dL (ref 30.0–36.0)
MCV: 95.2 fL (ref 80.0–100.0)
Platelets: 446 10*3/uL — ABNORMAL HIGH (ref 150–400)
RBC: 4.8 MIL/uL (ref 4.22–5.81)
RDW: 15 % (ref 11.5–15.5)
WBC: 6.1 10*3/uL (ref 4.0–10.5)
nRBC: 0 % (ref 0.0–0.2)

## 2019-03-14 LAB — BASIC METABOLIC PANEL
Anion gap: 10 (ref 5–15)
BUN: 11 mg/dL (ref 8–23)
CO2: 26 mmol/L (ref 22–32)
Calcium: 9.5 mg/dL (ref 8.9–10.3)
Chloride: 100 mmol/L (ref 98–111)
Creatinine, Ser: 1.08 mg/dL (ref 0.61–1.24)
GFR calc Af Amer: >60 mL/min (ref >60)
GFR calc non Af Amer: >60 mL/min (ref >60)
Glucose, Bld: 101 mg/dL — ABNORMAL HIGH (ref 70–99)
Potassium: 4 mmol/L (ref 3.5–5.1)
Sodium: 136 mmol/L (ref 135–145)

## 2019-03-14 LAB — TROPONIN I (HIGH SENSITIVITY)
Troponin I (High Sensitivity): 4 ng/L (ref <18)
Troponin I (High Sensitivity): 3 ng/L (ref <18)

## 2019-03-14 MED ORDER — IBUPROFEN 400 MG PO TABS: 400.0000 mg | ORAL_TABLET | Freq: Once | ORAL | Status: DC

## 2019-03-14 NOTE — ED Triage Notes (Signed)
Pt brought to ED by [facility] from work for c/o left side cp radiating to his mid chest since this morning getting worse this evening with some increase SOB. Pt denies contact with COVID 19 pt.  324 mg ASA, 2 Nitro sl given by EMS pta with no relief. SR on the monitor with some 1st AV block.  BP 126/87, HR 65, /r-18, SPO2 94% RA.

## 2019-03-14 NOTE — Discharge Instructions (Addendum)
Your tests today did not show any sign of a heart attack.  Take ibuprofen or acetaminophen as needed for pain.  Return if symptoms are getting worse.

## 2019-03-14 NOTE — ED Provider Notes (Signed)
Jose Clay Provider Note   CSN: [id number] Arrival date & time: 03/14/19  1930   History Chief Complaint  Patient presents with  . Chest Pain    Jose Clay is a 64 y.o. male.  The history is provided by the patient.  Chest Pain He has history of hypertension, asthma, GERD and comes in because of an episode of chest pain which started about 2 PM.  Pain started in the left mid chest with radiation toward the shoulder.  Pain was achy and he rated it at 5/10.  Pain then moved to the midsternal area.  There was some associated dyspnea but no nausea or diaphoresis.  Nothing made the pain better, nothing made it worse.  It was not affected by deep breathing, body position, eating, exertion.  He did come in by ambulance and was given nitroglycerin which did seem to give some slight relief.  Pain is similar to what he had prior to having a cardiac catheterization about 15 months ago, but catheterization did not show any significant coronary lesions.  Past Medical History:  Diagnosis Date  . Asthma   . GERD (gastroesophageal reflux disease)   . 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 Hypertension   . Myocardial infarction ([place]) 2017  . OSA on CPAP     Patient Active Problem List   Diagnosis Date Noted  . CAD (coronary artery disease) 11/01/2018  . OSA (obstructive sleep apnea)   . Essential hypertension   . RUQ abdominal pain 08/22/2015  . Pancreatitis, acute 08/22/2015    Past Surgical History:  Procedure Laterality Date  . CARDIAC CATHETERIZATION  01/10/2018  . JOINT REPLACEMENT    . LEFT HEART CATH AND CORONARY ANGIOGRAPHY N/A 01/10/2018   Procedure: LEFT HEART CATH AND CORONARY ANGIOGRAPHY;  Surgeon: Leonie Man, MD;  Location: [facility];  Service: Cardiovascular;  Laterality: N/A;  . NASAL SINUS SURGERY  1990s  . RECONSTRUCTION URETHROPLASTY  2012 X 5   "S/P fell, straddled a beam"  .  SHOULDER ARTHROSCOPY W/ ROTATOR CUFF REPAIR Right ~ 2014  . SPLENECTOMY  1988  . TONSILLECTOMY    . TOTAL HIP ARTHROPLASTY Right 07/2015       Family History  Problem Relation Age of Onset  . Colon cancer Father   . Diabetes Mother     Social History   Tobacco Use  . Smoking status: Former Smoker    Packs/day: 0.50    Years: 2.00    Pack years: 1.00    Types: Cigarettes    Quit date: 1981    Years since quitting: 40.0  . Smokeless tobacco: Never Used  Substance Use Topics  . Alcohol use: Yes    Alcohol/week: 14.0 standard drinks    Types: 14 Glasses of wine per week    Comment: 01/10/2018 "red wine"  . Drug use: Never    Home Medications Prior to Admission medications   Medication Sig Start Date End Date Taking? Authorizing Provider  albuterol (VENTOLIN HFA) 108 (90 Base) MCG/ACT inhaler Inhale 2 puffs into the lungs as needed.    [provider]  aspirin EC 81 MG EC tablet Take 1 tablet (81 mg total) by mouth daily. 01/12/18   Bhagat, Crista Luria, PA  atorvastatin (LIPITOR) 10 MG tablet TAKE 1 TABLET BY MOUTH EVERY DAY 08/29/18   Revankar, Reita Cliche, MD  budesonide-formoterol (SYMBICORT) 160-4.5 MCG/ACT inhaler Inhale 2 puffs into the  lungs 2 (two) times daily.    [provider]  cetirizine (ZYRTEC) 10 MG tablet Take 10 mg by mouth daily.    [provider]  EPINEPHrine (EPIPEN 2-PAK) 0.3 mg/0.3 mL IJ SOAJ injection Self inject per package instructions as needed for severe allergic reaction and seek medical attention. Pharmacist: Substitution permitted. 05/27/16   Molpus, John, MD  esomeprazole (NEXIUM) 40 MG capsule Take 80 mg by mouth daily at 12 noon.     [provider]  gabapentin (NEURONTIN) 600 MG tablet Take 600 mg by mouth 2 (two) times daily.     [provider]  losartan-hydrochlorothiazide (HYZAAR) 100-12.5 MG tablet Take 1 tablet by mouth daily. for blood pressure 06/03/15   [provider]  meloxicam  (MOBIC) 7.5 MG tablet Take 7.5 mg by mouth daily.    [provider]  nitroGLYCERIN (NITROSTAT) 0.4 MG SL tablet Place 1 tablet (0.4 mg total) under the tongue every 5 (five) minutes as needed. 08/01/18 11/01/18  Revankar, Reita Cliche, MD    Allergies    Bee venom and Shellfish allergy  Review of Systems   Review of Systems  Cardiovascular: Positive for chest pain.  All other systems reviewed and are negative.   Physical Exam Updated Vital Signs BP (!) 133/93 (BP Location: Left Arm)   Pulse 62   Temp 98.6 F (37 C) (Oral)   Resp 18   Ht 6' (1.829 m)   Wt 108.9 kg   SpO2 93%   BMI 32.55 kg/m   Physical Exam Vitals and nursing note reviewed.   64 year old male, resting comfortably and in no acute distress. Vital signs are significant for borderline elevated blood pressure. Oxygen saturation is 93%, which is normal. Head is normocephalic and atraumatic. PERRLA, EOMI. Oropharynx is clear. Neck is nontender and supple without adenopathy or JVD. Back is nontender and there is no CVA tenderness. Lungs are clear without rales, wheezes, or rhonchi. Chest is nontender. Heart has regular rate and rhythm without murmur. Abdomen is soft, flat, nontender without masses or hepatosplenomegaly and peristalsis is normoactive. Extremities have no cyanosis or edema, full range of motion is present. Skin is warm and dry without rash. Neurologic: Mental status is normal, cranial nerves are intact, there are no motor or sensory deficits.  ED Results / Procedures / Treatments   Labs (all labs ordered are listed, but only abnormal results are displayed) Labs Reviewed  BASIC METABOLIC PANEL - Abnormal; Notable for the following components:      Result Value   Glucose, Bld 101 (*)    All other components within normal limits  CBC - Abnormal; Notable for the following components:   Platelets 446 (*)    All other components within normal limits  TROPONIN I (HIGH SENSITIVITY)  TROPONIN I  (HIGH SENSITIVITY)    EKG EKG Interpretation  Date/Time:  Wednesday March 14 2019 19:23:51 EST Ventricular Rate:  71 PR Interval:  200 QRS Duration: 148 QT Interval:  460 QTC Calculation: 499 R Axis:   -8 Text Interpretation: Sinus rhythm with Premature atrial complexes Right bundle branch block Inferior infarct , age undetermined Abnormal ECG When compared with ECG of 07/24/2018, Premature atrial complexes are no longer present Confirmed by Delora Fuel ([id number]) on 03/14/2019 11:00:13 PM   Radiology DG Chest 2 View  Result Date: 03/14/2019 CLINICAL DATA:  Left-sided chest pain for the past day. EXAM: CHEST - 2 VIEW COMPARISON:  Chest x-ray dated July 24, 2018. FINDINGS: The heart  size and mediastinal contours are within normal limits. Normal pulmonary vascularity. No focal consolidation, pleural effusion, or pneumothorax. Unchanged bibasilar scarring. No acute osseous abnormality. Old left-sided rib fractures again noted. IMPRESSION: No active cardiopulmonary disease. Electronically Signed   By: Titus Dubin M.D.   On: 03/14/2019 19:50    Procedures Procedures  Medications Ordered in ED Medications  ibuprofen (ADVIL) tablet 400 mg (has no administration in time range)    ED Course  I have reviewed the triage vital signs and the nursing notes.  Pertinent labs & imaging results that were available during my care of the patient were reviewed by me and considered in my medical decision making (see chart for details).  MDM Rules/Calculators/A&P Chest pain of uncertain cause.  ECG shows right bundle branch block which is unchanged from prior.  Chest x-ray is normal.  Troponin is normal x2.  Old records are reviewed and his cardiac catheterization November 2019 showed only a 45% lesion in the mid LAD with some intramyocardial bridging.  Heart score is 2 which puts him at low risk for major adverse cardiac events in the next 6 weeks.  He is felt to be safe for discharge, advised to use  over-the-counter analgesics as needed for pain.  Return precautions discussed.  Final Clinical Impression(s) / ED Diagnoses Final diagnoses:  Atypical chest pain  Thrombocytosis [facility])    Rx / DC Orders ED Discharge Orders    None       Delora Fuel, MD [id number]

## 2019-03-15 DIAGNOSIS — J4540 Moderate persistent asthma, uncomplicated: Secondary | ICD-10-CM | POA: Diagnosis not present

## 2019-03-15 DIAGNOSIS — R0602 Shortness of breath: Secondary | ICD-10-CM | POA: Diagnosis not present

## 2019-03-19 ENCOUNTER — Other Ambulatory Visit: Payer: Self-pay | Admitting: Cardiology

## 2019-03-19 DIAGNOSIS — E662 Morbid (severe) obesity with alveolar hypoventilation: Secondary | ICD-10-CM | POA: Diagnosis not present

## 2019-03-19 DIAGNOSIS — J4540 Moderate persistent asthma, uncomplicated: Secondary | ICD-10-CM | POA: Diagnosis not present

## 2019-03-19 DIAGNOSIS — J301 Allergic rhinitis due to pollen: Secondary | ICD-10-CM | POA: Diagnosis not present

## 2019-03-19 DIAGNOSIS — G4733 Obstructive sleep apnea (adult) (pediatric): Secondary | ICD-10-CM | POA: Diagnosis not present

## 2019-03-27 DIAGNOSIS — I2699 Other pulmonary embolism without acute cor pulmonale: Secondary | ICD-10-CM | POA: Diagnosis not present

## 2019-03-27 DIAGNOSIS — R0602 Shortness of breath: Secondary | ICD-10-CM | POA: Diagnosis not present

## 2019-04-11 ENCOUNTER — Other Ambulatory Visit: Payer: Self-pay | Admitting: Cardiology

## 2019-04-19 ENCOUNTER — Other Ambulatory Visit: Payer: Self-pay

## 2019-04-19 ENCOUNTER — Encounter: Payer: Self-pay | Admitting: Cardiology

## 2019-04-19 ENCOUNTER — Ambulatory Visit (INDEPENDENT_AMBULATORY_CARE_PROVIDER_SITE_OTHER): Payer: BC Managed Care – PPO | Admitting: Cardiology

## 2019-04-19 VITALS — BP 128/86 | HR 80 | Ht 72.0 in | Wt 254.0 lb

## 2019-04-19 DIAGNOSIS — I10 Essential (primary) hypertension: Secondary | ICD-10-CM

## 2019-04-19 DIAGNOSIS — I2510 Atherosclerotic heart disease of native coronary artery without angina pectoris: Secondary | ICD-10-CM

## 2019-04-19 DIAGNOSIS — G4733 Obstructive sleep apnea (adult) (pediatric): Secondary | ICD-10-CM | POA: Diagnosis not present

## 2019-04-19 NOTE — Progress Notes (Signed)
Cardiology Office Note:    Date:  04/19/2019   ID:  Jose Clay, DOB 10/12/1955, MRN [id number]  PCP:  Cyndi Bender, PA-C  Cardiologist:  Jenean Lindau, MD   Referring MD: Cyndi Bender, PA-C    ASSESSMENT:    1. Coronary artery disease involving native coronary artery of native heart without angina pectoris   2. Essential hypertension   3. OSA (obstructive sleep apnea)    PLAN:    In order of problems listed above:  1. Coronary artery disease: Secondary prevention stressed with the patient.  Importance of compliance with diet and medication stressed and he vocalized understanding.  Importance of regular exercise stressed weight reduction stressed risk of obesity explained and he promises to comply.  He has a good diet plan already but he is going to try to make it better. 2. Essential hypertension: Blood pressure is better.  Echocardiogram will be done to assess murmur heard on auscultation. 3. Mixed dyslipidemia and overweight status: Diet was discussed patient plans to further intensify his efforts to lose weight 4. Sleep apnea: He wears his sleep mask regularly.  Sleep health issues were discussed 5. Patient will be seen in follow-up appointment in 6 months or earlier if the patient has any concerns    Medication Adjustments/Labs and Tests Ordered: Current medicines are reviewed at length with the patient today.  Concerns regarding medicines are outlined above.  No orders of the defined types were placed in this encounter.  No orders of the defined types were placed in this encounter.    Chief Complaint  Patient presents with  . Follow-up     History of Present Illness:    Jose Clay is a 64 y.o. male.  Patient has known coronary artery disease, essential hypertension dyslipidemia and obstructive sleep apnea.  He denies any problems at this time and takes care of activities of daily living.  No chest pain orthopnea or PND.  He went to the hospital with  chest pain symptoms and this was found to be atypical and sent home.  I reviewed emergency room records.  Subsequently standpoint he walks about half an hour without any problems.  He sees a pulmonologist and an echo has been suggested by the pulmonologist.  No orthopnea.  At the time of my evaluation, the patient is alert awake oriented and in no distress.  Past Medical History:  Diagnosis Date  . Asthma   . CAD (coronary artery disease) 11/01/2018  . Essential hypertension   . GERD (gastroesophageal reflux disease)   . 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 Hypertension   . Myocardial infarction ([place]) 2017  . OSA (obstructive sleep apnea)   . OSA on CPAP   . Pancreatitis, acute 08/22/2015  . RUQ abdominal pain 08/22/2015    Past Surgical History:  Procedure Laterality Date  . CARDIAC CATHETERIZATION  01/10/2018  . JOINT REPLACEMENT    . LEFT HEART CATH AND CORONARY ANGIOGRAPHY N/A 01/10/2018   Procedure: LEFT HEART CATH AND CORONARY ANGIOGRAPHY;  Surgeon: Leonie Man, MD;  Location: [facility];  Service: Cardiovascular;  Laterality: N/A;  . NASAL SINUS SURGERY  1990s  . RECONSTRUCTION URETHROPLASTY  2012 X 5   "S/P fell, straddled a beam"  . SHOULDER ARTHROSCOPY W/ ROTATOR CUFF REPAIR Right ~ 2014  . SPLENECTOMY  1988  . TONSILLECTOMY    . TOTAL HIP ARTHROPLASTY Right 07/2015    Current Medications: Current  Meds  Medication Sig  . albuterol (VENTOLIN HFA) 108 (90 Base) MCG/ACT inhaler Inhale 2 puffs into the lungs as needed.  Marland Kitchen aspirin EC 81 MG EC tablet Take 1 tablet (81 mg total) by mouth daily.  Marland Kitchen atorvastatin (LIPITOR) 10 MG tablet TAKE 1 TABLET BY MOUTH EVERY DAY  . budesonide-formoterol (SYMBICORT) 160-4.5 MCG/ACT inhaler Inhale 2 puffs into the lungs 2 (two) times daily.  Marland Kitchen EPINEPHrine (EPIPEN 2-PAK) 0.3 mg/0.3 mL IJ SOAJ injection Self inject per package instructions as needed for severe allergic reaction and seek medical  attention. Pharmacist: Substitution permitted.  Marland Kitchen esomeprazole (NEXIUM) 40 MG capsule Take 80 mg by mouth daily at 12 noon.   . gabapentin (NEURONTIN) 600 MG tablet Take 600 mg by mouth 2 (two) times daily.   Marland Kitchen ipratropium-albuterol (DUONEB) 0.5-2.5 (3) MG/3ML SOLN USE 1 VIAL VIA NEBULIZER 4 TIMES A DAY FOR 10 DAYS  . losartan-hydrochlorothiazide (HYZAAR) 100-12.5 MG tablet Take 1 tablet by mouth daily. for blood pressure  . nitroGLYCERIN (NITROSTAT) 0.4 MG SL tablet Place 1 tablet (0.4 mg total) under the tongue every 5 (five) minutes as needed.     Allergies:   Bee venom and Shellfish allergy   Social History   Socioeconomic History  . Marital status: Married    Spouse name: Not on file  . Number of children: Not on file  . Years of education: Not on file  . Highest education level: Not on file  Occupational History  . Not on file  Tobacco Use  . Smoking status: Former Smoker    Packs/day: 0.50    Years: 2.00    Pack years: 1.00    Types: Cigarettes    Quit date: 1981    Years since quitting: 40.1  . Smokeless tobacco: Never Used  Substance and Sexual Activity  . Alcohol use: Yes    Alcohol/week: 14.0 standard drinks    Types: 14 Glasses of wine per week    Comment: 01/10/2018 "red wine"  . Drug use: Never  . Sexual activity: Yes  Other Topics Concern  . Not on file  Social History Narrative  . Not on file   Social Determinants of Health   Financial Resource Strain:   . Difficulty of Paying Living Expenses: Not on file  Food Insecurity:   . Worried About Charity fundraiser in the Last Year: Not on file  . Ran Out of Food in the Last Year: Not on file  Transportation Needs:   . Lack of Transportation (Medical): Not on file  . Lack of Transportation (Non-Medical): Not on file  Physical Activity:   . Days of Exercise per Week: Not on file  . Minutes of Exercise per Session: Not on file  Stress:   . Feeling of Stress : Not on file  Social Connections:   .  Frequency of Communication with Friends and Family: Not on file  . Frequency of Social Gatherings with Friends and Family: Not on file  . Attends Religious Services: Not on file  . Active Member of Clubs or Organizations: Not on file  . Attends Archivist Meetings: Not on file  . Marital Status: Not on file     Family History: The patient's family history includes Colon cancer in his father; Diabetes in his mother.  ROS:   Please see the history of present illness.    All other systems reviewed and are negative.  EKGs/Labs/Other Studies Reviewed:    The following studies were reviewed  today: Leonie Man, MD Study date: 01/10/18  MyChart Results Release  MyChart Status: Pending Results Release  Physicians  Panel Physicians Referring Physician Case Authorizing Physician  Leonie Man, MD (Primary)    Procedures  LEFT HEART CATH AND CORONARY ANGIOGRAPHY  Conclusion    Mid LAD lesion is 45% stenosed -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  The left ventricular systolic function is normal. The left ventricular ejection fraction is 55-65% by visual estimate.  LV end diastolic pressure is normal.   SUMMARY:  Angiographically minimal CAD with exception of mild mid-distal LAD myocardial bridging.  Normal LV size and function  RECOMMENDATIONS:  Evaluate for noncardiac cause for chest pain.  Recommend Aspirin 81mg  daily for moderate CAD.     Glenetta Hew, M.D., M.S. Interventional Cardiologist       Recent Labs: 03/14/2019: BUN 11; Creatinine, Ser 1.08; Hemoglobin 15.4; Platelets 446; Potassium 4.0; Sodium 136  Recent Lipid Panel    Component Value Date/Time   CHOL 132 03/22/2018 1033   TRIG 119 03/22/2018 1033   HDL 39 (L) 03/22/2018 1033   CHOLHDL 3.4 03/22/2018 1033   CHOLHDL 4.4 01/10/2018 0929   VLDL 35 01/10/2018 0929   LDLCALC 69 03/22/2018 1033    Physical Exam:    VS:  BP 128/86   Pulse 80   Ht 6' (1.829  m)   Wt 254 lb (115.2 kg)   SpO2 94%   BMI 34.45 kg/m     Wt Readings from Last 3 Encounters:  04/19/19 254 lb (115.2 kg)  03/14/19 240 lb (108.9 kg)  11/01/18 234 lb (106.1 kg)     GEN: Patient is in no acute distress HEENT: Normal NECK: No JVD; No carotid bruits LYMPHATICS: No lymphadenopathy CARDIAC: Hear sounds regular, 2/6 systolic murmur at the apex. RESPIRATORY:  Clear to auscultation without rales, wheezing or rhonchi  ABDOMEN: Soft, non-tender, non-distended MUSCULOSKELETAL:  No edema; No deformity  SKIN: Warm and dry NEUROLOGIC:  Alert and oriented x 3 PSYCHIATRIC:  Normal affect   Signed, Jenean Lindau, MD  04/19/2019 9:39 AM    [facility]

## 2019-04-19 NOTE — Patient Instructions (Signed)
Medication Instructions:  No medication changes. *If you need a refill on your cardiac medications before your next appointment, please call your pharmacy*   Lab Work: None ordered If you have labs (blood work) drawn today and your tests are completely normal, you will receive your results only by: . MyChart Message (if you have MyChart) OR . A paper copy in the mail If you have any lab test that is abnormal or we need to change your treatment, we will call you to review the results.   Testing/Procedures: Your physician has requested that you have an echocardiogram. Echocardiography is a painless test that uses sound waves to create images of your heart. It provides your doctor with information about the size and shape of your heart and how well your heart's chambers and valves are working. This procedure takes approximately one hour. There are no restrictions for this procedure.     Follow-Up: At [facility], you and your health needs are our priority.  As part of our continuing mission to provide you with exceptional heart care, we have created designated Provider Care Teams.  These Care Teams include your primary Cardiologist (physician) and Advanced Practice Providers (APPs -  Physician Assistants and Nurse Practitioners) who all work together to provide you with the care you need, when you need it.  We recommend signing up for the patient portal called "MyChart".  Sign up information is provided on this After Visit Summary.  MyChart is used to connect with patients for Virtual Visits (Telemedicine).  Patients are able to view lab/test results, encounter notes, upcoming appointments, etc.  Non-urgent messages can be sent to your provider as well.   To learn more about what you can do with MyChart, go to [email or web address].    Your next appointment:   4 month(s)  The format for your next appointment:   In Person  Provider:   Rajan Revankar, MD   Other  Instructions  Echocardiogram An echocardiogram is a procedure that uses painless sound waves (ultrasound) to produce an image of the heart. Images from an echocardiogram can provide important information about:  Signs of coronary artery disease (CAD).  Aneurysm detection. An aneurysm is a weak or damaged part of an artery wall that bulges out from the normal force of blood pumping through the body.  Heart size and shape. Changes in the size or shape of the heart can be associated with certain conditions, including heart failure, aneurysm, and CAD.  Heart muscle function.  Heart valve function.  Signs of a past heart attack.  Fluid buildup around the heart.  Thickening of the heart muscle.  A tumor or infectious growth around the heart valves. Tell a health care provider about:  Any allergies you have.  All medicines you are taking, including vitamins, herbs, eye drops, creams, and over-the-counter medicines.  Any blood disorders you have.  Any surgeries you have had.  Any medical conditions you have.  Whether you are pregnant or may be pregnant. What are the risks? Generally, this is a safe procedure. However, problems may occur, including:  Allergic reaction to dye (contrast) that may be used during the procedure. What happens before the procedure? No specific preparation is needed. You may eat and drink normally. What happens during the procedure?   An IV tube may be inserted into one of your veins.  You may receive contrast through this tube. A contrast is an injection that improves the quality of the pictures from your heart.  A   gel will be applied to your chest.  A wand-like tool (transducer) will be moved over your chest. The gel will help to transmit the sound waves from the transducer.  The sound waves will harmlessly bounce off of your heart to allow the heart images to be captured in real-time motion. The images will be recorded on a computer. The  procedure may vary among health care providers and hospitals. What happens after the procedure?  You may return to your normal, everyday life, including diet, activities, and medicines, unless your health care provider tells you not to do that. Summary  An echocardiogram is a procedure that uses painless sound waves (ultrasound) to produce an image of the heart.  Images from an echocardiogram can provide important information about the size and shape of your heart, heart muscle function, heart valve function, and fluid buildup around your heart.  You do not need to do anything to prepare before this procedure. You may eat and drink normally.  After the echocardiogram is completed, you may return to your normal, everyday life, unless your health care provider tells you not to do that. This information is not intended to replace advice given to you by your health care provider. Make sure you discuss any questions you have with your health care provider. Document Revised: 05/25/2018 Document Reviewed: 03/06/2016 Elsevier Patient Education  2020 Elsevier Inc.  

## 2019-05-06 ENCOUNTER — Other Ambulatory Visit: Payer: Self-pay | Admitting: Cardiology

## 2019-05-28 ENCOUNTER — Ambulatory Visit (INDEPENDENT_AMBULATORY_CARE_PROVIDER_SITE_OTHER): Payer: BC Managed Care – PPO

## 2019-05-28 ENCOUNTER — Other Ambulatory Visit: Payer: Self-pay

## 2019-05-28 DIAGNOSIS — I2510 Atherosclerotic heart disease of native coronary artery without angina pectoris: Secondary | ICD-10-CM

## 2019-05-28 NOTE — Progress Notes (Signed)
Complete echocardiogram performed.  Jimmy Lurlene Ronda RDCS, RVT  

## 2019-06-12 ENCOUNTER — Telehealth: Payer: Self-pay | Admitting: Cardiology

## 2019-06-12 NOTE — Telephone Encounter (Signed)
Patient's wife states she is requesting to discuss results from echocardiogram completed on 05/28/19. She is also inquiring about whether it is recommended for the patient to schedule a follow up appointment with Dr. Geraldo Pitter. Please advise.

## 2019-06-12 NOTE — Telephone Encounter (Signed)
Patient states he is returning call regarding his echocardiogram results.

## 2019-06-12 NOTE — Telephone Encounter (Signed)
Spoke with the pt and reviewed results. Pt had no questions and verbalized understanding.

## 2019-06-26 DIAGNOSIS — R5383 Other fatigue: Secondary | ICD-10-CM | POA: Diagnosis not present

## 2019-06-26 DIAGNOSIS — E662 Morbid (severe) obesity with alveolar hypoventilation: Secondary | ICD-10-CM | POA: Diagnosis not present

## 2019-06-26 DIAGNOSIS — J301 Allergic rhinitis due to pollen: Secondary | ICD-10-CM | POA: Diagnosis not present

## 2019-06-26 DIAGNOSIS — G4733 Obstructive sleep apnea (adult) (pediatric): Secondary | ICD-10-CM | POA: Diagnosis not present

## 2019-06-26 DIAGNOSIS — J4540 Moderate persistent asthma, uncomplicated: Secondary | ICD-10-CM | POA: Diagnosis not present

## 2019-08-08 DIAGNOSIS — R7303 Prediabetes: Secondary | ICD-10-CM | POA: Diagnosis not present

## 2019-08-08 DIAGNOSIS — I10 Essential (primary) hypertension: Secondary | ICD-10-CM | POA: Diagnosis not present

## 2019-08-08 DIAGNOSIS — I2510 Atherosclerotic heart disease of native coronary artery without angina pectoris: Secondary | ICD-10-CM | POA: Diagnosis not present

## 2019-08-08 DIAGNOSIS — G4733 Obstructive sleep apnea (adult) (pediatric): Secondary | ICD-10-CM | POA: Diagnosis not present

## 2019-08-08 DIAGNOSIS — E7800 Pure hypercholesterolemia, unspecified: Secondary | ICD-10-CM | POA: Diagnosis not present

## 2019-08-08 DIAGNOSIS — Z9081 Acquired absence of spleen: Secondary | ICD-10-CM | POA: Diagnosis not present

## 2019-11-22 DIAGNOSIS — Z1331 Encounter for screening for depression: Secondary | ICD-10-CM | POA: Diagnosis not present

## 2019-11-22 DIAGNOSIS — K6289 Other specified diseases of anus and rectum: Secondary | ICD-10-CM | POA: Diagnosis not present

## 2019-11-22 DIAGNOSIS — Z6835 Body mass index (BMI) 35.0-35.9, adult: Secondary | ICD-10-CM | POA: Diagnosis not present

## 2019-12-04 DIAGNOSIS — Z1159 Encounter for screening for other viral diseases: Secondary | ICD-10-CM

## 2019-12-04 DIAGNOSIS — K602 Anal fissure, unspecified: Secondary | ICD-10-CM

## 2019-12-04 DIAGNOSIS — K6289 Other specified diseases of anus and rectum: Secondary | ICD-10-CM

## 2019-12-04 HISTORY — DX: Other specified diseases of anus and rectum: K62.89

## 2019-12-04 HISTORY — DX: Anal fissure, unspecified: K60.2

## 2019-12-04 HISTORY — DX: Encounter for screening for other viral diseases: Z11.59

## 2019-12-06 ENCOUNTER — Other Ambulatory Visit: Payer: Self-pay | Admitting: Cardiology

## 2019-12-20 DIAGNOSIS — Z20822 Contact with and (suspected) exposure to covid-19: Secondary | ICD-10-CM | POA: Diagnosis not present

## 2019-12-20 DIAGNOSIS — K6289 Other specified diseases of anus and rectum: Secondary | ICD-10-CM | POA: Diagnosis not present

## 2019-12-20 DIAGNOSIS — K602 Anal fissure, unspecified: Secondary | ICD-10-CM | POA: Diagnosis not present

## 2019-12-20 DIAGNOSIS — Z01812 Encounter for preprocedural laboratory examination: Secondary | ICD-10-CM | POA: Diagnosis not present

## 2019-12-26 DIAGNOSIS — I252 Old myocardial infarction: Secondary | ICD-10-CM | POA: Diagnosis not present

## 2019-12-26 DIAGNOSIS — Z79899 Other long term (current) drug therapy: Secondary | ICD-10-CM | POA: Diagnosis not present

## 2019-12-26 DIAGNOSIS — E785 Hyperlipidemia, unspecified: Secondary | ICD-10-CM | POA: Diagnosis not present

## 2019-12-26 DIAGNOSIS — I2510 Atherosclerotic heart disease of native coronary artery without angina pectoris: Secondary | ICD-10-CM | POA: Diagnosis not present

## 2019-12-26 DIAGNOSIS — M1990 Unspecified osteoarthritis, unspecified site: Secondary | ICD-10-CM | POA: Diagnosis not present

## 2019-12-26 DIAGNOSIS — E669 Obesity, unspecified: Secondary | ICD-10-CM | POA: Diagnosis not present

## 2019-12-26 DIAGNOSIS — K602 Anal fissure, unspecified: Secondary | ICD-10-CM | POA: Diagnosis not present

## 2019-12-26 DIAGNOSIS — K219 Gastro-esophageal reflux disease without esophagitis: Secondary | ICD-10-CM | POA: Diagnosis not present

## 2019-12-26 DIAGNOSIS — K624 Stenosis of anus and rectum: Secondary | ICD-10-CM | POA: Diagnosis not present

## 2019-12-26 DIAGNOSIS — Z87891 Personal history of nicotine dependence: Secondary | ICD-10-CM | POA: Diagnosis not present

## 2019-12-26 DIAGNOSIS — G4733 Obstructive sleep apnea (adult) (pediatric): Secondary | ICD-10-CM | POA: Diagnosis not present

## 2019-12-26 DIAGNOSIS — I10 Essential (primary) hypertension: Secondary | ICD-10-CM | POA: Diagnosis not present

## 2019-12-26 DIAGNOSIS — J45909 Unspecified asthma, uncomplicated: Secondary | ICD-10-CM | POA: Diagnosis not present

## 2019-12-26 DIAGNOSIS — K6289 Other specified diseases of anus and rectum: Secondary | ICD-10-CM | POA: Diagnosis not present

## 2019-12-26 DIAGNOSIS — Z9989 Dependence on other enabling machines and devices: Secondary | ICD-10-CM | POA: Diagnosis not present

## 2020-01-08 DIAGNOSIS — K602 Anal fissure, unspecified: Secondary | ICD-10-CM | POA: Diagnosis not present

## 2020-03-11 DIAGNOSIS — Z20822 Contact with and (suspected) exposure to covid-19: Secondary | ICD-10-CM | POA: Diagnosis not present

## 2020-04-03 DIAGNOSIS — E7800 Pure hypercholesterolemia, unspecified: Secondary | ICD-10-CM | POA: Diagnosis not present

## 2020-04-03 DIAGNOSIS — I10 Essential (primary) hypertension: Secondary | ICD-10-CM | POA: Diagnosis not present

## 2020-04-03 DIAGNOSIS — I2510 Atherosclerotic heart disease of native coronary artery without angina pectoris: Secondary | ICD-10-CM | POA: Diagnosis not present

## 2020-04-03 DIAGNOSIS — Z125 Encounter for screening for malignant neoplasm of prostate: Secondary | ICD-10-CM | POA: Diagnosis not present

## 2020-04-03 DIAGNOSIS — R1013 Epigastric pain: Secondary | ICD-10-CM | POA: Diagnosis not present

## 2020-04-03 DIAGNOSIS — Z9081 Acquired absence of spleen: Secondary | ICD-10-CM | POA: Diagnosis not present

## 2020-04-03 DIAGNOSIS — R7303 Prediabetes: Secondary | ICD-10-CM | POA: Diagnosis not present

## 2020-04-08 ENCOUNTER — Other Ambulatory Visit: Payer: Self-pay

## 2020-04-08 DIAGNOSIS — Z9989 Dependence on other enabling machines and devices: Secondary | ICD-10-CM | POA: Insufficient documentation

## 2020-04-08 DIAGNOSIS — G4733 Obstructive sleep apnea (adult) (pediatric): Secondary | ICD-10-CM | POA: Insufficient documentation

## 2020-04-08 DIAGNOSIS — I10 Essential (primary) hypertension: Secondary | ICD-10-CM | POA: Insufficient documentation

## 2020-04-08 DIAGNOSIS — J45909 Unspecified asthma, uncomplicated: Secondary | ICD-10-CM | POA: Insufficient documentation

## 2020-04-08 DIAGNOSIS — K219 Gastro-esophageal reflux disease without esophagitis: Secondary | ICD-10-CM | POA: Insufficient documentation

## 2020-04-10 ENCOUNTER — Other Ambulatory Visit: Payer: Self-pay

## 2020-04-10 ENCOUNTER — Encounter: Payer: Self-pay | Admitting: Cardiology

## 2020-04-10 ENCOUNTER — Ambulatory Visit (INDEPENDENT_AMBULATORY_CARE_PROVIDER_SITE_OTHER): Payer: BC Managed Care – PPO | Admitting: Cardiology

## 2020-04-10 VITALS — BP 142/84 | HR 77 | Ht 72.0 in | Wt 256.1 lb

## 2020-04-10 DIAGNOSIS — I2510 Atherosclerotic heart disease of native coronary artery without angina pectoris: Secondary | ICD-10-CM | POA: Diagnosis not present

## 2020-04-10 DIAGNOSIS — I10 Essential (primary) hypertension: Secondary | ICD-10-CM

## 2020-04-10 DIAGNOSIS — R079 Chest pain, unspecified: Secondary | ICD-10-CM

## 2020-04-10 DIAGNOSIS — G4733 Obstructive sleep apnea (adult) (pediatric): Secondary | ICD-10-CM | POA: Diagnosis not present

## 2020-04-10 DIAGNOSIS — E782 Mixed hyperlipidemia: Secondary | ICD-10-CM

## 2020-04-10 HISTORY — DX: Mixed hyperlipidemia: E78.2

## 2020-04-10 NOTE — Progress Notes (Unsigned)
Cardiology Office Note:    Date:  04/10/2020   ID:  Jose Clay, Jose Clay [birth year], MRN [id number]  PCP:  Cyndi Bender, PA-C  Cardiologist:  Jenean Lindau, MD   Referring MD: Cyndi Bender, PA-C    ASSESSMENT:    1. Coronary artery disease involving native coronary artery of native heart without angina pectoris   2. Essential hypertension   3. OSA (obstructive sleep apnea)   4. Mixed dyslipidemia    PLAN:    In order of problems listed above:  1. Dyspnea on exertion: I discussed my findings with him at extensive length.  He is concerned about it.  He leads a sedentary lifestyle and does not have a good activity or exercise pattern.  I cautioned him about this.  I find it very unlikely that he has coronary artery disease causing the symptoms.  His coronary angiography in 2019 was unremarkable.  However in view of myocardial bridging and his concerns I will do exercise stress Cardiolite to assess this and to reassure him.  He is agreeable. 2. Essential hypertension: Blood pressure stable and diet was emphasized. 3. Mixed dyslipidemia: Lipids were reviewed from the last time.  He has not had them done for some time now and I will recheck them. 4. Obesity: Weight reduction was stressed and diet was emphasized and he promises to do better. 5. Sleep apnea: Sleep health issues were discussed 6. Patient will be seen in follow-up appointment in 4 weeks or earlier if the patient has any concerns    Medication Adjustments/Labs and Tests Ordered: Current medicines are reviewed at length with the patient today.  Concerns regarding medicines are outlined above.  No orders of the defined types were placed in this encounter.  No orders of the defined types were placed in this encounter.    No chief complaint on file.    History of Present Illness:    Jose Clay is a 65 y.o. male.  Patient has past medical history of nonobstructive coronary artery disease, myocardial bridging,  obesity, essential hypertension and dyslipidemia.  He has obstructive sleep apnea.  He denies any problems at this time his only issue is that he has dyspnea on exertion which is concerning to him.  No chest pain orthopnea or PND.  At the time of my evaluation, the patient is alert awake oriented and in no distress.  Past Medical History:  Diagnosis Date  . Abnormal nuclear cardiac imaging test 08/13/2016  . Abnormal nuclear stress test 09/22/2017  . Anal fissure 12/04/2019  . Anal pain 12/04/2019  . Asthma   . CAD (coronary artery disease) 11/01/2018  . Essential hypertension   . GERD (gastroesophageal reflux disease)   . 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 Hypertension   . Myocardial infarction ([place]) 2017  . OSA (obstructive sleep apnea)   . OSA on CPAP   . Pancreatitis, acute 08/22/2015  . Preoperative cardiovascular examination 08/12/2016  . RUQ abdominal pain 08/22/2015  . Screening examination for other arthropod-borne viral diseases 12/04/2019    Past Surgical History:  Procedure Laterality Date  . CARDIAC CATHETERIZATION  01/10/2018  . JOINT REPLACEMENT    . LEFT HEART CATH AND CORONARY ANGIOGRAPHY N/A 01/10/2018   Procedure: LEFT HEART CATH AND CORONARY ANGIOGRAPHY;  Surgeon: Leonie Man, MD;  Location: [place] [facility];  Service: Cardiovascular;  Laterality: N/A;  . NASAL SINUS SURGERY  1990s  . RECONSTRUCTION URETHROPLASTY  2012  X 5   "S/P fell, straddled a beam"  . SHOULDER ARTHROSCOPY W/ ROTATOR CUFF REPAIR Right ~ 2014  . SPLENECTOMY  1988  . TONSILLECTOMY    . TOTAL HIP ARTHROPLASTY Right 07/2015    Current Medications: Current Meds  Medication Sig  . albuterol (VENTOLIN HFA) 108 (90 Base) MCG/ACT inhaler Inhale 2 puffs into the lungs as needed for shortness of breath.  Marland Kitchen atorvastatin (LIPITOR) 10 MG tablet TAKE 1 TABLET BY MOUTH EVERY DAY  . budesonide-formoterol (SYMBICORT) 160-4.5 MCG/ACT inhaler Inhale 2 puffs into the lungs as  needed for wheezing.  Marland Kitchen EPINEPHrine 0.3 mg/0.3 mL IJ SOAJ injection Inject 0.3 mg into the muscle as needed for anaphylaxis.  Marland Kitchen esomeprazole (NEXIUM) 40 MG capsule Take 80 mg by mouth daily at 12 noon.   . gabapentin (NEURONTIN) 600 MG tablet Take 600 mg by mouth 3 (three) times daily.  Marland Kitchen losartan-hydrochlorothiazide (HYZAAR) 100-25 MG tablet Take 1 tablet by mouth daily.  . nitroGLYCERIN (NITROSTAT) 0.4 MG SL tablet Place 0.4 mg under the tongue every 5 (five) minutes as needed for chest pain.  Marland Kitchen sucralfate (CARAFATE) 1 g tablet Take 1 g by mouth 3 (three) times daily.     Allergies:   Bee venom and Shellfish allergy   Social History   Socioeconomic History  . Marital status: Married    Spouse name: Not on file  . Number of children: Not on file  . Years of education: Not on file  . Highest education level: Not on file  Occupational History  . Not on file  Tobacco Use  . Smoking status: Former Smoker    Packs/day: 0.50    Years: 2.00    Pack years: 1.00    Types: Cigarettes    Quit date: 1981    Years since quitting: 41.1  . Smokeless tobacco: Never Used  Vaping Use  . Vaping Use: Never used  Substance and Sexual Activity  . Alcohol use: Yes    Alcohol/week: 14.0 standard drinks    Types: 14 Glasses of wine per week    Comment: 01/10/2018 "red wine"  . Drug use: Never  . Sexual activity: Yes  Other Topics Concern  . Not on file  Social History Narrative  . Not on file   Social Determinants of Health   Financial Resource Strain: Not on file  Food Insecurity: Not on file  Transportation Needs: Not on file  Physical Activity: Not on file  Stress: Not on file  Social Connections: Not on file     Family History: The patient's family history includes Colon cancer in his father; Diabetes in his mother.  ROS:   Please see the history of present illness.    All other systems reviewed and are negative.  EKGs/Labs/Other Studies Reviewed:    The following studies  were reviewed today: EKG reveals sinus rhythm and nonspecific ST-T changes   Recent Labs: No results found for requested labs within last 8760 hours.  Recent Lipid Panel    Component Value Date/Time   CHOL 132 03/22/2018 1033   TRIG 119 03/22/2018 1033   HDL 39 (L) 03/22/2018 1033   CHOLHDL 3.4 03/22/2018 1033   CHOLHDL 4.4 01/10/2018 0929   VLDL 35 01/10/2018 0929   LDLCALC 69 03/22/2018 1033    Physical Exam:    VS:  BP (!) 142/84   Pulse 77   Ht 6' (1.829 m)   Wt 256 lb 1.9 oz (116.2 kg)   SpO2 96%  BMI 34.74 kg/m     Wt Readings from Last 3 Encounters:  04/10/20 256 lb 1.9 oz (116.2 kg)  04/19/19 254 lb (115.2 kg)  03/14/19 240 lb (108.9 kg)     GEN: Patient is in no acute distress HEENT: Normal NECK: No JVD; No carotid bruits LYMPHATICS: No lymphadenopathy CARDIAC: Hear sounds regular, 2/6 systolic murmur at the apex. RESPIRATORY:  Clear to auscultation without rales, wheezing or rhonchi  ABDOMEN: Soft, non-tender, non-distended MUSCULOSKELETAL:  No edema; No deformity  SKIN: Warm and dry NEUROLOGIC:  Alert and oriented x 3 PSYCHIATRIC:  Normal affect   Signed, Jenean Lindau, MD  04/10/2020 4:30 PM    [facility]

## 2020-04-10 NOTE — Patient Instructions (Signed)
Medication Instructions:  Your physician recommends that you continue on your current medications as directed. Please refer to the Current Medication list given to you today.  *If you need a refill on your cardiac medications before your next appointment, please call your pharmacy*   Lab Work: Your physician recommends that you return for lab work when fasting: bmp, pro bnp, lipid  If you have labs (blood work) drawn today and your tests are completely normal, you will receive your results only by: Marland Kitchen MyChart Message (if you have MyChart) OR . A paper copy in the mail If you have any lab test that is abnormal or we need to change your treatment, we will call you to review the results.   Testing/Procedures:   [facility] Cardiovascular Imaging at [facility] [address] [place], [place] [address] Phone: [phone]    Please arrive 15 minutes prior to your appointment time for registration and insurance purposes.  The test will take approximately 3 to 4 hours to complete; you may bring reading material.  If someone comes with you to your appointment, they will need to remain in the main lobby due to limited space in the testing area. **If you are pregnant or breastfeeding, please notify the nuclear lab prior to your appointment**  How to prepare for your Myocardial Perfusion Test: . Do not eat or drink 3 hours prior to your test, except you may have water. . Do not consume products containing caffeine (regular or decaffeinated) 12 hours prior to your test. (ex: coffee, chocolate, sodas, tea). . Do bring a list of your current medications with you.  If not listed below, you may take your medications as normal.  . Do wear comfortable clothes (no dresses or overalls) and walking shoes, tennis shoes preferred (No heels or open toe shoes are allowed). . Do NOT wear cologne, perfume, aftershave, or lotions (deodorant is allowed). . If these instructions are not  followed, your test will have to be rescheduled.  Please report to [address] for your test.  If you have questions or concerns about your appointment, you can call the Nuclear Lab at [phone].  If you cannot keep your appointment, please provide 24 hours notification to the Nuclear Lab, to avoid a possible $50 charge to your account.    Follow-Up: At [facility], you and your health needs are our priority.  As part of our continuing mission to provide you with exceptional heart care, we have created designated Provider Care Teams.  These Care Teams include your primary Cardiologist (physician) and Advanced Practice Providers (APPs -  Physician Assistants and Nurse Practitioners) who all work together to provide you with the care you need, when you need it.  We recommend signing up for the patient portal called "MyChart".  Sign up information is provided on this After Visit Summary.  MyChart is used to connect with patients for Virtual Visits (Telemedicine).  Patients are able to view lab/test results, encounter notes, upcoming appointments, etc.  Non-urgent messages can be sent to your provider as well.   To learn more about what you can do with MyChart, go to [email or web address].    Your next appointment:   1 month(s)  The format for your next appointment:   In Person  Provider:   Jyl Heinz, MD   Other Instructions   Cardiac Nuclear Scan A cardiac nuclear scan is a test that measures blood flow to the heart when a person is  resting and when he or she is exercising. The test looks for problems such as:  Not enough blood reaching a portion of the heart.  The heart muscle not working normally. You may need this test if:  You have heart disease.  You have had abnormal lab results.  You have had heart surgery or a balloon procedure to open up blocked arteries (angioplasty).  You have chest pain.  You have shortness of breath. In this test,  a radioactive dye (tracer) is injected into your bloodstream. After the tracer has traveled to your heart, an imaging device is used to measure how much of the tracer is absorbed by or distributed to various areas of your heart. This procedure is usually done at a hospital and takes 2-4 hours. Tell a health care provider about:  Any allergies you have.  All medicines you are taking, including vitamins, herbs, eye drops, creams, and over-the-counter medicines.  Any problems you or family members have had with anesthetic medicines.  Any blood disorders you have.  Any surgeries you have had.  Any medical conditions you have.  Whether you are pregnant or may be pregnant. What are the risks? Generally, this is a safe procedure. However, problems may occur, including:  Serious chest pain and heart attack. This is only a risk if the stress portion of the test is done.  Rapid heartbeat.  Sensation of warmth in your chest. This usually passes quickly.  Allergic reaction to the tracer. What happens before the procedure?  Ask your health care provider about changing or stopping your regular medicines. This is especially important if you are taking diabetes medicines or blood thinners.  Follow instructions from your health care provider about eating or drinking restrictions.  Remove your jewelry on the day of the procedure. What happens during the procedure?  An IV will be inserted into one of your veins.  Your health care provider will inject a small amount of radioactive tracer through the IV.  You will wait for 20-40 minutes while the tracer travels through your bloodstream.  Your heart activity will be monitored with an electrocardiogram (ECG).  You will lie down on an exam table.  Images of your heart will be taken for about 15-20 minutes.  You may also have a stress test. For this test, one of the following may be done: ? You will exercise on a treadmill or stationary bike.  While you exercise, your heart's activity will be monitored with an ECG, and your blood pressure will be checked. ? You will be given medicines that will increase blood flow to parts of your heart. This is done if you are unable to exercise.  When blood flow to your heart has peaked, a tracer will again be injected through the IV.  After 20-40 minutes, you will get back on the exam table and have more images taken of your heart.  Depending on the type of tracer used, scans may need to be repeated 3-4 hours later.  Your IV line will be removed when the procedure is over. The procedure may vary among health care providers and hospitals. What happens after the procedure?  Unless your health care provider tells you otherwise, you may return to your normal schedule, including diet, activities, and medicines.  Unless your health care provider tells you otherwise, you may increase your fluid intake. This will help to flush the contrast dye from your body. Drink enough fluid to keep your urine pale yellow.  Ask your  health care provider, or the department that is doing the test: ? When will my results be ready? ? How will I get my results? Summary  A cardiac nuclear scan measures the blood flow to the heart when a person is resting and when he or she is exercising.  Tell your health care provider if you are pregnant.  Before the procedure, ask your health care provider about changing or stopping your regular medicines. This is especially important if you are taking diabetes medicines or blood thinners.  After the procedure, unless your health care provider tells you otherwise, increase your fluid intake. This will help flush the contrast dye from your body.  After the procedure, unless your health care provider tells you otherwise, you may return to your normal schedule, including diet, activities, and medicines. This information is not intended to replace advice given to you by your health  care provider. Make sure you discuss any questions you have with your health care provider. Document Revised: 07/18/2017 Document Reviewed: 07/18/2017 Elsevier Patient Education  [place].

## 2020-04-13 NOTE — Progress Notes (Signed)
Pt called to cancel his pre-procedure covid test for his procedure with Dr. Geraldo Pitter on Thurs 04/17/20, due to him not feeling well. Pt advised to be sure to call Dr. Julien Nordmann office to inform him as well.

## 2020-04-14 ENCOUNTER — Telehealth (HOSPITAL_COMMUNITY): Payer: Self-pay | Admitting: Cardiology

## 2020-04-14 NOTE — Telephone Encounter (Signed)
Patient called and cancelled Myoview and states he will call back at a later date to reschedule per Romilda Garret.  Order will be removed from the [place] and when pt calls back to reschedule we will reinstate the order.

## 2020-04-15 ENCOUNTER — Inpatient Hospital Stay (HOSPITAL_COMMUNITY)
Admission: RE | Admit: 2020-04-15 | Discharge: 2020-04-15 | Disposition: A | Discharge status: Home / Self Care | Payer: BC Managed Care – PPO | Source: Ambulatory Visit

## 2020-04-17 ENCOUNTER — Encounter (HOSPITAL_COMMUNITY): Payer: BC Managed Care – PPO

## 2020-04-26 DIAGNOSIS — W260XXA Contact with knife, initial encounter: Secondary | ICD-10-CM | POA: Diagnosis not present

## 2020-04-26 DIAGNOSIS — Y92814 Boat as the place of occurrence of the external cause: Secondary | ICD-10-CM | POA: Diagnosis not present

## 2020-04-26 DIAGNOSIS — Y9389 Activity, other specified: Secondary | ICD-10-CM | POA: Diagnosis not present

## 2020-04-26 DIAGNOSIS — Y990 Civilian activity done for income or pay: Secondary | ICD-10-CM | POA: Diagnosis not present

## 2020-04-26 DIAGNOSIS — Z23 Encounter for immunization: Secondary | ICD-10-CM | POA: Diagnosis not present

## 2020-04-26 DIAGNOSIS — S61512A Laceration without foreign body of left wrist, initial encounter: Secondary | ICD-10-CM | POA: Diagnosis not present

## 2020-04-26 DIAGNOSIS — I10 Essential (primary) hypertension: Secondary | ICD-10-CM | POA: Diagnosis not present

## 2020-04-26 DIAGNOSIS — S6992XA Unspecified injury of left wrist, hand and finger(s), initial encounter: Secondary | ICD-10-CM | POA: Diagnosis not present

## 2020-06-09 ENCOUNTER — Other Ambulatory Visit: Payer: Self-pay | Admitting: Cardiology

## 2020-06-16 ENCOUNTER — Ambulatory Visit: Payer: BC Managed Care – PPO | Admitting: Cardiology

## 2020-09-16 ENCOUNTER — Telehealth: Payer: Self-pay | Admitting: Gastroenterology

## 2020-09-16 ENCOUNTER — Telehealth: Payer: Self-pay

## 2020-09-16 ENCOUNTER — Encounter: Payer: Self-pay | Admitting: Gastroenterology

## 2020-09-16 ENCOUNTER — Ambulatory Visit: Payer: Medicare Other | Admitting: Gastroenterology

## 2020-09-16 VITALS — BP 120/80 | HR 60 | Ht 73.0 in | Wt 259.0 lb

## 2020-09-16 DIAGNOSIS — R1011 Right upper quadrant pain: Secondary | ICD-10-CM

## 2020-09-16 DIAGNOSIS — K219 Gastro-esophageal reflux disease without esophagitis: Secondary | ICD-10-CM

## 2020-09-16 DIAGNOSIS — R1012 Left upper quadrant pain: Secondary | ICD-10-CM | POA: Insufficient documentation

## 2020-09-16 HISTORY — DX: Left upper quadrant pain: R10.12

## 2020-09-16 MED ORDER — PEG-KCL-NACL-NASULF-NA ASC-C 100 G PO SOLR: 1.0000 | Freq: Once | ORAL | 0 refills | Status: DC

## 2020-09-16 MED ORDER — ESOMEPRAZOLE MAGNESIUM 20 MG PO CPDR: 40.0000 mg | DELAYED_RELEASE_CAPSULE | Freq: Every day | ORAL | Status: AC

## 2020-09-16 MED ORDER — HYOSCYAMINE SULFATE 0.125 MG SL SUBL: 0.1250 mg | SUBLINGUAL_TABLET | Freq: Four times a day (QID) | SUBLINGUAL | 1 refills | Status: DC | PRN

## 2020-09-16 NOTE — Telephone Encounter (Signed)
ROI - Dr. Melina Copa  Location where fax was sent: Dr. Melina Copa  Documents Faxed: Signed ROI Confirmation received: Yes Documents and confirmation held for future reference: Yes

## 2020-09-16 NOTE — Telephone Encounter (Signed)
Error to PA listed below. PA completed WAS for [place] NOT [place]. PA has been resubmitted with alternate diagnosis. Will await response.  Ted Mcalpine (KeyYH:[id number])  Your information has been submitted to [place]. Blue Cross [place] will review the request and notify you of the determination decision directly, typically within 3 business days of your submission and once all necessary information is received.  You will also receive your request decision electronically. To check for an update later, open the request again from your dashboard.  If [employer] has not responded within the specified timeframe or if you have any questions about your PA submission, contact [place] directly at [facility]) [phone] or ([place]) [phone].  DARRELL Silverstein Key: [id number] help? Call us at [phone] Status Sent to [place] 0.'125MG'$  tablets Form Blue Cross [place] Medicare Part D Electronic Request Form (CB)

## 2020-09-16 NOTE — Telephone Encounter (Signed)
PRIOR AUTHORIZATION  PA initiation date: 09/16/20  Medication: [place]: [employer] Submission completed electronically through [employer] My Meds: Yes  Will await insurance response re: approval/denial.  Ted Mcalpine (KeyQK:[id number])  Your information has been submitted to [place]. Blue Cross [place] will review the request and notify you of the determination decision directly, typically within 3 business days of your submission and once all necessary information is received.  You will also receive your request decision electronically. To check for an update later, open the request again from your dashboard.  If [employer] [place] has not responded within the specified timeframe or if you have any questions about your PA submission, contact [place] directly at [facility]) [phone] or ([place]) [phone].  DARRELL Vaquera Key: [id number] help? Call us at [phone] Status Sent to [place] 0.'125MG'$  tablets Form Blue Cross [place] Medicare Part D Electronic Request Form (CB)

## 2020-09-16 NOTE — Telephone Encounter (Signed)
Addressed in a previously created encounter. Closing as duplicate

## 2020-09-16 NOTE — Progress Notes (Addendum)
09/16/2020 Jose Clay [id number] 06/21/1955   HISTORY OF PRESENT ILLNESS: This is a 65 year old male who is a patient of Dr. Ardis Hughs, assigned to him when he was seen by me for a hospital follow-up back in July 2017.  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  He is here today with complaints of abdominal pain.  He reports intermittent right upper quadrant and also sometimes left upper quadrant abdominal pains.  He says that he a lot of times he feels generalized abdominal bloating.  There are some days that he does not have any pain or bloating at all and in fact he has going a couple weeks at a time without any abdominal pain at all.  On the other hand it has woken him up from sleep in the middle the night.  Pain is in the right upper quadrant and sometimes states in the left upper quadrant that radiates around to the left side of his back.  Pain is not debilitating, but uncomfortable.  No associated nausea or vomiting.  He does have longstanding acid reflux and takes Nexium 40 mg daily.  That works for him for the most part, but on occasion if he eats [place], etc. he still will have some heartburn and reflux.  He tells me he had an EGD many years ago.  His PCP gave him Carafate to see if that would help with the abdominal pain, but he did not notice a difference; that prescription has now run out.  He tells me that his last colonoscopy was 2 or 3 years ago with Dr. Melina Copa in [place].  Referred here on this occasion by his PCP, Cyndi Bender, PA-C.   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"    Hypertension    Mixed dyslipidemia 04/10/2020   Myocardial infarction ([place]) 2017   OSA (obstructive sleep apnea)    OSA on CPAP    Pancreatitis, acute 08/22/2015   Preoperative cardiovascular examination 08/12/2016   RUQ abdominal pain 08/22/2015   Screening examination for other arthropod-borne viral diseases 12/04/2019   Past Surgical History:  Procedure Laterality Date   CARDIAC CATHETERIZATION  01/10/2018   JOINT REPLACEMENT     LEFT HEART CATH AND CORONARY ANGIOGRAPHY N/A 01/10/2018   Procedure: LEFT HEART CATH AND CORONARY ANGIOGRAPHY;  Surgeon: Leonie Man, MD;  Location: [facility];  Service: Cardiovascular;  Laterality: N/A;   NASAL SINUS SURGERY  1990s   RECONSTRUCTION URETHROPLASTY  2012 X 5   "S/P fell, straddled a beam"   SHOULDER ARTHROSCOPY W/ ROTATOR CUFF REPAIR Right ~ 2014   SPLENECTOMY  1988   TONSILLECTOMY     TOTAL HIP ARTHROPLASTY Right 07/2015    reports that he quit smoking about 41 years ago. His smoking use included cigarettes. He has a 1.00 pack-year smoking history. He has never used smokeless tobacco. He reports current alcohol use of about 14.0 standard drinks of alcohol per week. He reports that he does not use drugs. family history includes Colon cancer in his father; Diabetes in his  mother. Allergies  Allergen Reactions   Bee Venom Swelling   Shellfish Allergy Shortness Of Breath, Swelling and Rash    Oyster, scallops  Oyster, scallops       Outpatient Encounter Medications as of 09/16/2020  Medication Sig   albuterol (VENTOLIN HFA) 108 (90 Base) MCG/ACT inhaler Inhale 2 puffs into the lungs as needed for shortness of breath.   atorvastatin (LIPITOR) 10 MG tablet Take 10 mg by mouth daily.   budesonide-formoterol (SYMBICORT) 160-4.5 MCG/ACT inhaler Inhale 2 puffs into the lungs as needed for wheezing.   EPINEPHrine 0.3 mg/0.3 mL IJ SOAJ injection Inject 0.3 mg into the muscle as needed for anaphylaxis.   esomeprazole (NEXIUM) 40 MG  capsule Take 80 mg by mouth daily at 12 noon.    gabapentin (NEURONTIN) 600 MG tablet Take 600 mg by mouth 3 (three) times daily.   losartan-hydrochlorothiazide (HYZAAR) 100-25 MG tablet Take 1 tablet by mouth daily.   nitroGLYCERIN (NITROSTAT) 0.4 MG SL tablet Place 0.4 mg under the tongue every 5 (five) minutes as needed for chest pain.   sucralfate (CARAFATE) 1 g tablet Take 1 g by mouth 3 (three) times daily.   No facility-administered encounter medications on file as of 09/16/2020.     REVIEW OF SYSTEMS  : All other systems reviewed and negative except where noted in the History of Present Illness.   PHYSICAL EXAM: BP 120/80   Pulse 60   Ht '6\' 1"'$  (1.854 m)   Wt 259 lb (117.5 kg)   BMI 34.17 kg/m  General: Well developed white male in no acute distress Head: Normocephalic and atraumatic Eyes:  Sclerae anicteric, conjunctiva pink. Ears: Normal auditory acuity Lungs: Clear throughout to auscultation; no W/R/R. Heart: Regular rate and rhythm; no M/R/G. Abdomen: Soft, non-distended.  BS present.  Non-tender. Musculoskeletal: Symmetrical with no gross deformities  Skin: No lesions on visible extremities Extremities: No edema  Neurological: Alert oriented x 4, grossly non-focal Psychological:  Alert and cooperative. Normal mood and affect  ASSESSMENT AND PLAN: *65 year old male with complaints of intermittent right upper quadrant and left upper quadrant abdominal pains.  Have been going for quite some time.  They are not debilitating, but definitely uncomfortable, and sometimes waking him from sleep at night.  He has longstanding GERD and is on Nexium 40 mg daily, but still has intermittent symptoms even with that dose of medication.  Last EGD was many years ago.  Addition of Carafate did not help symptoms.  No other associated symptoms.  Not 100% sure was going on.  Majority the pain seems to be right upper quadrant.  We will check abdominal ultrasound and if unremarkable proceed with  HIDA scan.  We will also plan for EGD with Dr. Ardis Hughs.  Will try Levsin prn.  Prescription sent to pharmacy.  **The risks, benefits, and alternatives to EGD were discussed with the patient and he consents to proceed.  **We will obtain most recent colonoscopy records from Dr. Melina Copa in [place] as the patient reports having a colonoscopy with him 2 to 3 years ago.  Records obtained.  Last colonoscopy was August 2019 at which time the patient had a 6 mm sessile polyp removed from the transverse colon and a semi-pedunculated polyp 8 mm in size removed from the hepatic flexure.  There was copious liquid and particulate stool in the colon with numerous fecoliths.  It was reported that this was washed and a good view was obtained.  He also had marked diverticulosis involved in the  entire colon.  They recommended a repeat in 5 years with more extensive bowel prep.  The 2 polyps were tubular adenomas without high-grade dysplasia.  CC:  Cyndi Bender, PA-C

## 2020-09-16 NOTE — Telephone Encounter (Signed)
DENIAL  Medication: [place]: [employer] PA response: Denied Rationale: Awaiting letter. Cover My Meds indicates need for pt to try and fail alternatives Misc. Notes: DARRELL Peggs (Key: [id number])  This request has not been approved. [employer] will send a letter to the member and you regarding this decision and the right to appeal.  ]  DARRELL Donson Key: [id number] help? Call us at [phone] Outcome Deniedtoday Drug Levsin 0.'125MG'$  tablets Form [employer] [place] Medicare Part D Electronic Request Form (CB)

## 2020-09-16 NOTE — Telephone Encounter (Signed)
2nd PA denied for Levsin as well. Please advise how you wish to proceed in terms of an alternative medication.

## 2020-09-16 NOTE — Patient Instructions (Signed)
It was my pleasure to provide care to you today. Based on our discussion, I am providing you with my recommendations below:  RECOMMENDATION(S):   PRESCRIPTION MEDICATION(S):   We have sent the following medication(s) to your pharmacy:  Levsin  NOTE: If your medication(s) requires a PRIOR AUTHORIZATION, we will receive notification from your pharmacy. Once received, the process to submit for approval may take up to 7-10 business days. You will be contacted about any denials we have received from your insurance company as well as alternatives recommended by your provider.  RECORDS:  We have asked you to sign a Release of Information today to obtain records from Dr. Melina Copa.   IMAGING:  You will be contacted by [facility] (Your caller ID will indicate phone # [phone]) within the next business 7-10 business days to schedule your HIDA AND ABDOMINAL ULTRASOUND. If you have not heard from them within 7-10 business days, please call [facility] at [phone] to follow up on the status of your appointment.     ENDOSCOPY:   You have been scheduled for an endoscopy. Please follow written instructions given to you at your visit today.  INHALERS:   If you use inhalers (even only as needed), please bring them with you on the day of your procedure.  FOLLOW UP:  After your procedure, you will receive a call from my office staff regarding my recommendation for follow up.  BMI:  If you are age 9 or older, your body mass index should be between 23-30. Your There is no height or weight on file to calculate BMI. If this is out of the aforementioned range listed, please consider follow up with your Primary Care Provider.  MY CHART:  The [facility] providers would like to encourage you to use [facility] to communicate with providers for non-urgent requests or questions.  Due to long hold times on the telephone, sending your provider a message by [facility] may  be a faster and more efficient way to get a response.  Please allow 48 business hours for a response.  Please remember that this is for non-urgent requests.   Thank you for trusting me with your gastrointestinal care!    Alonza Bogus, PA-C

## 2020-09-17 ENCOUNTER — Other Ambulatory Visit: Payer: Self-pay

## 2020-09-17 DIAGNOSIS — R1011 Right upper quadrant pain: Secondary | ICD-10-CM

## 2020-09-17 MED ORDER — DICYCLOMINE HCL 20 MG PO TABS: 20.0000 mg | ORAL_TABLET | Freq: Two times a day (BID) | ORAL | 2 refills | Status: DC

## 2020-09-17 NOTE — Telephone Encounter (Signed)
Rx changed per Jose Clay's orders. May still require PA but will await response from pharmacy if needed. Called pt to make him aware of new orders. LVM requesting returned call.

## 2020-09-18 ENCOUNTER — Telehealth: Payer: Self-pay

## 2020-09-18 NOTE — Telephone Encounter (Signed)
Prior Authorization for Dicyclomine has been submitted through CoverMyMeds

## 2020-09-18 NOTE — Telephone Encounter (Signed)
Records received and placed on Jose Clay desk for her to review.

## 2020-09-18 NOTE — Telephone Encounter (Signed)
Aleatha Borer, CMA   Dicyclomine Prior Authorization  Reason for call    Conversation: Dicyclomine Prior Authorization [employer]) September 18, 2020  Aleatha Borer, [place]      9:25 AM Note Prior Authorization for Dicyclomine has been submitted through [facility]

## 2020-09-19 NOTE — Telephone Encounter (Signed)
Patient has been approved for Dicyclomine.

## 2020-09-22 ENCOUNTER — Telehealth: Payer: Self-pay | Admitting: Gastroenterology

## 2020-09-22 NOTE — Progress Notes (Signed)
I agree with the above note, plan 

## 2020-09-22 NOTE — Telephone Encounter (Signed)
Patient wife calling to inform patient has COVID.  Appointment cancelled for 09/23/20 (Tue) at 9:00am EGD with DR. Ardis Hughs.  Thank you

## 2020-09-23 ENCOUNTER — Encounter: Payer: Medicare Other | Admitting: Gastroenterology

## 2020-10-08 ENCOUNTER — Other Ambulatory Visit (HOSPITAL_COMMUNITY): Payer: Medicare Other

## 2020-10-08 ENCOUNTER — Ambulatory Visit (HOSPITAL_COMMUNITY): Payer: Medicare Other

## 2020-10-22 ENCOUNTER — Ambulatory Visit (HOSPITAL_COMMUNITY): Admission: RE | Admit: 2020-10-22 | Payer: Medicare Other | Source: Ambulatory Visit

## 2020-10-22 ENCOUNTER — Encounter (HOSPITAL_COMMUNITY): Payer: Medicare Other

## 2020-11-03 ENCOUNTER — Encounter: Payer: Self-pay | Admitting: Gastroenterology

## 2020-11-03 ENCOUNTER — Telehealth: Payer: Self-pay | Admitting: Gastroenterology

## 2020-11-03 ENCOUNTER — Telehealth: Payer: Self-pay

## 2020-11-03 NOTE — Telephone Encounter (Signed)
Following sent via My Chart:  Letter by Milus Banister, MD on 11/03/2020      [facility] [place], [place]  [address] Phone:  [phone]   Fax:  [phone]      Jose Clay                          [id number] 1955-10-23   Procedure Date: 11/05/20 Arrival Time: 9am Procedure Time: 10am   Procedure [place] (4th Floor)                                    [place], [place] [address]   Check in with the receptionist on the 4th floor when you arrive  ____________________________________________________________________________   PREPARATION FOR ENDOSCOPY   On 11/05/20 THE DAY OF THE PROCEDURE:   1. No solid foods, milk or milk products are allowed after midnight the night before your procedure.   2. You may drink clear liquids until 7am, which is 3 hours before your procedure.    3. DO NOT drink anything colored red or purple. Avoid juices with pulp. NO orange juice. NO Milk products.    You should not have any gum, water, medicine or candy after this 3 hour stop time.    Clear liquids include:  NO RED & NO PURPLE  Water Jello  Ice Popsicles  Tea (sugar ok, no milk/cream) Powdered fruit flavored drinks  Coffee (sugar ok, no milk/cream) Gatorade  Juice: apple, white grape, white cranberry Lemonade, Kool-Aid  Clear bullion,broth (vegetable,chicken,beef) Carbonated beverages (any kind)  Strained chicken noodle soup  (no noodles or chicken) Hard Candy    Stop drinking all liquids including water, no gum, no candy or medications after 7am _______________________________________________________________________________   MEDICATION INSTRUCTIONS   Unless otherwise instructed, you should take regular prescription medications with a small sip of water as early as possible the morning of your procedure. You may continue taking your Aspirin.   Take allowed medicines by  7am  ____________________________________________________________________________________   CARE PARTNER   You will need 1 responsible adult at least 65 years of age to act as your care partner.  This person needs to arrive with you ,stay here during your procedure in the 4th floor lobby or parking lot, and drive you home. Keep their cell phone available for our call. We cannot start your procedure unless your care partner is present in our facility. The total time from sign in until discharge is approximately 2-3 hours.     COVID  Due to the COVID-19 pandemic we are asking patients to follow these guidelines.  Everyone entering the building has to wear a mask the whole time.  They need to practice social distancing.    WHAT TO WEAR/BRING   Wear loose fitting clothing that is easily removed.   Do not wear any lotions, perfumes or colognes. You may wear deodorant.  Leave jewelry and other valuables at home. However, you may wish to bring a book to read.  Your belongings will be placed under your stretcher.  Remove all body piercing jewelry and leave at home.   Cell phones ,Merchant navy officer, Acupuncturist  or any other electronic devices cannot be used in patient care areas/ recovery room.  If you use any type of inhaler please bring them with you.   ACTIVITY   Your Care Partner should plan to take you directly home after your procedure.  You should plan to take it easy, moving slowly for the rest of the day. DO NOT DRIVE ,use heavy machinery, DRINK ANY ALCOHOL  and do not return to work.  You can resume all normal activity the following day.     CANCELLATION POLICY We require 2 full business days advance notice for non-emergent cancellations of any procedure. Failure to give this notice may result in a fee:   $100 for a single procedure (upper or lower endoscopy) $200 for a double procedure (upper and lower endoscopy) ___________________________________________________________________    [place] Reasons Your Endoscopic Procedure May Be DELAYED OR CANCELLED:   If NO designated "Care Partner" with you who will remain in the lobby the entire time of your procedure and recovery.     IF YOU DO NOT FOLLOW YOUR PROCEDURE INSTRUCTIONS. Drinking liquids of any kind in any amount WITHIN 3 HOURS of your scheduled procedure, or eating ANY SOLID FOODS the day of your procedure will result in cancellation of your procedure.     If during your pre-procedure assessment you have any abnormal respiratory or cardiac problems, shortness of breath, irregular heart rhythm, a low baseline oxygenation level, a dangerously high or low baseline blood pressure level or a high or low blood sugar reading.   If you have a FEVER or any new cold or flu symptoms contact us immediately.    IF YOU HAVE ANY ER OR DOCTOR VISIT FOR AN ILLNESS, ESPECIALLY ANY HEART OR LUNG RELATED ISSUES, AFTER YOU HAVE YOUR GI VISIT, PLEASE CONTACT OUR OFFICE AT [phone]   __________________________________________________________________ Please use MYCHART to communicate with GI providers for non-urgent requests or questions. Please allow 48 business hours for a response. __________________________________________________________________   FREQUENTLY ASKED QUESTIONS    WHAT TYPES OF SEDATION ARE USED AT THE [facility]? There are two types of sedation.  Your gastroenterologist will decide which type you will need based on your personal medical issues as well as their own preference.  An IV of Normal Saline is required.   Moderate Sedation is rarely used. If you need this sedation, your doctor will discuss this with you.   Deep Sedation (Monitored Anesthesia Care) is achieved using a short acting IV anesthetic (diprivan, Propofol) that promotes relaxation and sleep.  This medicine is administered by a CRNA (nurse anesthetist) skilled and credentialed in [place].  You should not remember the procedure itself,  but you should be able to remember the discharge instructions and the discussion with your physician afterwards.  You will receive a separate bill for this type of sedation.  This type of sedation is generally more reliable for patients who take certain chronic medications or with certain medical conditions and so it may be preferred. If you have any questions about the type of sedation that will be used for your procedure, your gastroenterologist will be happy to discuss it with you. WHY DO I NEED A DRIVER?  The medicines used for your sedation cause delayed reflexes, impair thinking and judgment, and have some amnesic effect, therefore affecting your ability to drive safely. Even though you may feel alright, you are instructed to refrain from driving, operating any type of machinery, making any critical decisions, or signing any legal  documents until the following day.  ___________________________________________________________________ CAN I WEAR DENTURES OR HEARING AIDES?  Yes, you may wear your dentures OR hearing aides. However, you may be asked to remove them prior to your procedure.  CAN I  WEAR MY CONTACTS?  We advise that you leave your contact lenses at home and wear your glasses instead. If you do wear you lenses, you may be asked to remove them prior to your procedure so please bring a case for them and also a pair of glasses to wear after your procedure.  IS THE TEST SAFE DURING MY MENSTRUAL PERIOD?  Yes, your procedure can still be performed.  WILL THE DOCTOR TALK WITH ME AFTERWARDS? Yes, your physician will review the findings, follow-up care instructions, and treatment recommendations with you.  This will also be reviewed with your care partner if you have explicitly given [place] permission.  It is important that your care partner realizes you may not remember much after the procedure due to the effects of the anesthesia and so they will need to be able to review this information with you  later. HOW LONG WILL I BE THERE? The whole process takes 2-3 hours.  Most of that time is spent checking you in and waking you up safely after the procedure.  The endoscopy  itself takes 10-15 minutes usually.   Obviously, there may be unforseen delays and we will appreciate your patience if that occurs. WHAT TO EXPECT AFTER THE PROCEDURE?  Some feelings of bloating and passage of more gas is normal.  Walking can help get rid of the air and reduce the bloating.  WHAT CAN I EAT AFTER THE PROCEDURE?  We do recommend a small meal at first , but then you may proceed to your regular diet.  Drink plenty of fluids but avoid alcohol for 24 hours. WHAT IF THE WEATHER IS TERRIBLE?  Please CONTACT OUR OFFICE AT [phone] WHEN SEVERE WEATHER IS FORECAST so we may advise you of any delays or closures. [facility] news will broadcast our weather closings and updates. We will make every effort to contact you by phone or [facility] if the [place] is going to close or have a delayed opening due to weather.    SIGNATURES/CONFIDENTIALITY You have signed paperwork which will be entered into your electronic medical record.  This attests to the fact that that the information above has been reviewed and is understood.  Full responsibility of the confidentiality lies with you and/or your care-partner. _______________________________________________________________________________________   Judyann Munson FINANCIAL RESPONSIBILITY If you have insurance, you will need to contact your insurance company to verify that you have active coverage and to determine the amount of coverage they will provide. It is important to tell them that you are having the procedure performed at an [facility]). They can tell you what portion of the cost will be your responsibility, usually expressed either as a fixed amount or as a percentage of overall cost. We will also be contacting your insurance company with information about your procedure  in order to obtain pre-certification.  You must contact your insurance company as well, failure to do so may result in you having to pay a greater portion of the cost or even the total cost of the procedure.   YOU MAY RECEIVE THE FOLLOWING BILLS The [place] will bill a facility fee for use of the procedure room, medication and supplies. Your [place] gastroenterologist will bill a professional fee for performing the procedure. If  a biopsy is performed you will receive a bill from [facility] for their professional fee and a bill from [employer] for processing the pathology sample. If you receive diprivan for sedation during your procedure, you will receive a bill from [facility] for the administration of the medication.   Complaints or questions regarding billing, payment by third party payers or payment plans can be directed to the Customer Service Department of [facility], [address] [place], [place], [place] [address].  Phone inquiries may be made at [facility] health [phone], Monday through Friday 8am to 5 p.m., or you may visit the [facility] website at: [email or web address], click on "For Patients", then click "Questions about your bill.   [place] ([place])                                The [place] ([place]) is an independent, freestanding [place]) located on the fourth floor of the [facility] at [place], [place].  It is licensed by the [place], certified by [employer] and is accredited by [employer].    The [facility] physicians established the [place] in 1992. The physicians of [employer] joined the [facility] in 1999, and the [place] is now owned by [employer].  We completed a major renovation in 2006 and the expanded [place] now provides greater privacy and  comfort for our patients and their family members.  We have invested in state-of-the-art facilities and equipment to ensure that our patients receive the most up to date and best care.   At the [facility], board-certified gastroenterologists perform elective diagnostic and therapeutic endoscopic procedures such as endoscopy and colonoscopy.  Hours of operation are from 7:00 a.m. to 5:30 p.m. Monday - Friday.  Outside of the posted hours of operation, urgent or emergent care is provided at [place]. [place] physicians also provide 24-hour emergency coverage.  After hours and on weekends, the on-call physician may be reached by calling [phone].  The answering service will take a message and have the physician on call contact you.   Your Rights and Responsibilities as Our Patient  This center is owned by [employer] and is governed by the Gastroenterologists of [employer]. You may exercise the following rights without being subjected to discrimination or reprisal.                                     PATIENT RIGHTS- You have a right to : * Considerate, respectful, and safe care that is free from abuse or harassment.  * A discussion of your illness, what we can do about it, and the likely outcome of care.  * Know the names and roles of the people caring for you here.  * Respectful and effective pain management.  * Receive as much information to consent to or refuse a course of treatment or invasive procedure and to actively participate in decisions regarding your medical care.  * Involve your health care proxy or significant others in the decision making process for medical decisions.  * Reasonable continuity of care and to know in advance the time and location of an appointment as well as the doctor  you are seeing.  * Full consideration of personal privacy and confidentiality of your medical information. Your written permission will be obtained  prior to releasing any medical information. When we do release your information to others, we ask them to keep them confidential.  * Review your medical record and ask questions unless restricted by law.  * Know of any relationships with other parties that may influence your care.  * Know about rules that affect your care and about charges and payment methods. You have a right to receive and examine an explanation of your bill regardless of the source of payment.  * Receive assistance with the transfer of care from one doctor to another doctor within our practice or to an external doctor not in our practice.  * You have a right to develop a living will or healthcare power of attorney although, since the procedures that we do are not high risk, we will do all that is necessary to stabilize you including CPR if an emergency occurs. EMS will be called and you will be transferred to the hospital.  * Voice your concerns, complaints, or problems with the care you received by contacting our manager at [phone] or Nurse Manager at [phone]. If we are unable to satisfactorily address your complaint, you may contact the [place] by phone at [phone], [facility] by phone at [phone] or online at [email or web address] or the [facility] Complaint Intake Unit by phone at [phone] or [phone], by mail at [facility], [place], [place], or online at [email or web address].[email or web address].                        PATIENT RESPONSIBILITIES-You agree to :  * Provide accurate and complete information concerning your symptoms, past history, current health status, and medications including over-the-counter products and dietary supplements.  * Make known whether you clearly comprehend your medical care and what is expected of you in the plan of care.  * Participate in the development of the treatment plan and follow care instructions given to you.  * Follow the treatment plan and care  instructions given to you.  * Keep appointments and notify us if you are unable to do so.  * Accept responsibility for your actions if you refuse planned treatment or do not follow your doctor's orders.  * Accept financial responsibility for care received and pay promptly.  * Follow facility policies and procedures.  * Inform my doctor about any living will, medical healthcare power of attorney, or other directive that may affect my medical care.  * Be respectful of all healthcare providers and staff as well as other patients.  * Inform the staff of any discomfort or pain and patient safety issues.  * Share your values, beliefs, and traditions to help the staff provide appropriate care.  * Provide a responsible adult to transport you home and remain with you if you receive sedation medications.                                 Additional Information for Medicare Patients  All issues, concerns, or complaints can be reported by contacting our Pensions consultant. If we are unable to address your concerns, you may contact the following for assistance.  1. Medicare Ombudsman LoyaltyUs.is.asp  [place] Ombudsman - Aging and Adult Services [phone] http://www.dhhs.state.[place].us/aging [email or web address] .[email or web address]    Advance Directives - Living Will or  Health Care Power of Attorney Resources  For applicable state laws and sample forms for creating a living will or healthcare power of attorney, you may contact one of the following.  1. Caring Information Organization at (249) 618-0157 for English or 323-872-8562 for other languages or www.caringinfo.org 2. [place] DHHS Division of Aging and Adult Services at [phone] or www.dhhs.state.[place].us/aging  or          CommunicationFinder.com.au  [place] - MadSurgeon.co.nz.pdf            PeerTelevision.fr.pdf 3. [facility] at (973)584-1648 or 571-223-3465 or www.cchospice.org   Advance Directive Policy:  Please be aware that the procedures that we do in this facility are not high risk and that in an emergency, we will do all that is necessary to stabilize you including Basic Life Support (BLS) and Advanced Cardiac Life Support (ACLS).   If you present to this center for a procedure with a living will or valid Do Not Resuscitate Order (DNR) or Out of Facility form and you have an emergency, we will do all that we can to stabilize your medical condition and we may begin [place] (BLS) and Advanced [place] Life Support (ACLS). We will call 911 to transport you to the hospital. EMS will be informed of the Do Not Resuscitate Order or living will upon arrival.                                             COMPLAINT/GRIEVANCE PROCESS The [place] recognizes that patients have the right to voice concerns without fear of discrimination or reprisal, and to have these concerns reviewed and responded to in a timely manner. [facility] seeks to provide prompt review and timely resolution of complaints or grievances from any patient.     You may voice your concerns, complaints, or problems with the care you have received or are receiving to any staff member at any point in your care.  Every effort will be made to reconcile your concern or complaint while you are still in the [place].  However, if you wish to voice a concern or complaint after you have left the center, you may contact the Nursing Supervisor at [phone] or our Administrative Director of Gastroenterology at [phone].  If we are unable to satisfactorily address your complaint, you may contact the [place], Complaint Intake Unit ([address] [place], [place], [place], [place] [address], phone: [phone] or [phone]) or by e-mail at  www.dhhs.state.[place].[email or web address]/dhsr/ciu/complaintintake.html.  You may also contact the Office of the Medicare Ombudsman to file a grievance at [facility] ([phone]) or at [email or web address].es.[email or web address]

## 2020-11-03 NOTE — Telephone Encounter (Signed)
[facility] Pre-operative Risk Assessment     Jose Clay 05-Jul-1955 [id number]  Procedure: EGD Anesthesia type:  MAC Procedure Date: 11/05/20 Provider: Dr. Ardis Hughs  Type of Clearance needed: Cardiac  Please review request and advise by either responding to this message or by sending your response to the fax # provided below.  Thank you,  [facility]  Phone: [phone] Fax: [phone] ATTENTION: Shakeita Vandevander, LPN

## 2020-11-03 NOTE — Telephone Encounter (Signed)
Returned wife's call and advised prep instructions have been sent via My Chart. Verbalized acceptance and understanding.

## 2020-11-03 NOTE — Telephone Encounter (Signed)
Inbound call from pt's wife requesting a call back stating that she has misplaced her husband's instructions for his EGD for 11/05/20. Pt needs someone to go over instructions. Please advise. Thank you.

## 2020-11-03 NOTE — Telephone Encounter (Signed)
-----   Message from Milus Banister, MD sent at 10/30/2020  7:28 AM EDT ----- Regarding: RE: [place] pt Got it, tahnks  Patty, Can you please reach out to him and let him know about his need for cardiac clearance.  Cancel his upcoming [place] case.  Probably safe to reschedule for several weeks from now OR he can call back to reschedule after his cardiac clearance.  Thanks   ----- Message ----- From: Osvaldo Angst, CRNA Sent: 10/30/2020   6:08 AM EDT To: Milus Banister, MD Subject: [facility] pt                                         Dr. Ardis Hughs,  This pt is scheduled with you on 11/05/20.  In 2/21 he saw his cardiologist with a c/o SOB.  A myoview perfusion study was ordered but never performed.  We will need a cardiac clearance to clear him for care at [facility].  Thanks,  Osvaldo Angst

## 2020-11-04 NOTE — Telephone Encounter (Signed)
SECOND ATTEMPT:  Called pt and did not get a response but rather VM. Left another message requesting pt return my call. Also called pt wife to make her aware of my unsuccessful attempts in reaching pt. Advised wife of need to cancel procedure as documented below. Advised we will proceed with rescheduling procedure once cardiology has cleared pt to proceed. Verbalized acceptance and understanding.

## 2020-11-04 NOTE — Telephone Encounter (Signed)
Called pt to make him aware of need to cancel procedure pending further cardiac work up. LVM requesting returned call.

## 2020-11-05 ENCOUNTER — Encounter: Payer: Medicare Other | Admitting: Gastroenterology

## 2020-11-10 NOTE — Telephone Encounter (Signed)
Pt scheduled for clearance as follows:  Appointment Information  Name: Severo, Beber MRN: [id number]  Date: 12/26/2020 Status: Sch  Time: 3:00 PM Length: 20  Visit Type: OFFICE VISIT [1004] Copay: $25.00  Provider: Jenean Lindau, MD Department: [facility]  Referring Provider: Cyndi Bender CSN: [id number]  Notes: clearance  Made On: 11/04/2020 11:01 AM By: Nicolasa Ducking A

## 2020-12-16 ENCOUNTER — Other Ambulatory Visit: Payer: Self-pay | Admitting: Gastroenterology

## 2020-12-16 DIAGNOSIS — R1011 Right upper quadrant pain: Secondary | ICD-10-CM

## 2020-12-23 DIAGNOSIS — M25552 Pain in left hip: Secondary | ICD-10-CM | POA: Diagnosis not present

## 2020-12-23 DIAGNOSIS — J309 Allergic rhinitis, unspecified: Secondary | ICD-10-CM | POA: Diagnosis not present

## 2020-12-23 DIAGNOSIS — I10 Essential (primary) hypertension: Secondary | ICD-10-CM | POA: Diagnosis not present

## 2020-12-23 DIAGNOSIS — J45909 Unspecified asthma, uncomplicated: Secondary | ICD-10-CM | POA: Diagnosis not present

## 2020-12-23 DIAGNOSIS — I2510 Atherosclerotic heart disease of native coronary artery without angina pectoris: Secondary | ICD-10-CM | POA: Diagnosis not present

## 2020-12-25 ENCOUNTER — Other Ambulatory Visit: Payer: Self-pay

## 2020-12-26 ENCOUNTER — Ambulatory Visit: Payer: Medicare Other | Admitting: Cardiology

## 2020-12-26 ENCOUNTER — Encounter: Payer: Self-pay | Admitting: Cardiology

## 2020-12-26 ENCOUNTER — Other Ambulatory Visit: Payer: Self-pay

## 2020-12-26 VITALS — BP 140/86 | HR 75 | Ht 72.0 in | Wt 254.0 lb

## 2020-12-26 DIAGNOSIS — I2510 Atherosclerotic heart disease of native coronary artery without angina pectoris: Secondary | ICD-10-CM | POA: Diagnosis not present

## 2020-12-26 DIAGNOSIS — G4733 Obstructive sleep apnea (adult) (pediatric): Secondary | ICD-10-CM

## 2020-12-26 DIAGNOSIS — I10 Essential (primary) hypertension: Secondary | ICD-10-CM | POA: Diagnosis not present

## 2020-12-26 NOTE — Progress Notes (Signed)
Normal Cardiology Office Note:    Date:  12/26/2020   ID:  Jose Clay, DOB 1955-12-06, MRN [id number]  PCP:  Cyndi Bender, PA-C  Cardiologist:  Jenean Lindau, MD   Referring MD: Cyndi Bender, PA-C    ASSESSMENT:    1. Coronary artery disease involving native coronary artery of native heart without angina pectoris   2. Essential hypertension   3. OSA (obstructive sleep apnea)    PLAN:    In order of problems listed above:  Coronary artery disease: Secondary prevention stressed with patient.  Importance of compliance with diet medication stressed and vocalized understanding.  He was advised to walk at least half an hour a day or bicycle to the best of his ability keeping his orthopedic issue in mind. Essential hypertension: Blood pressure stable and diet was emphasized. Mixed dyslipidemia: Lipids were reviewed & followed by primary care.  He had blood work done recently and he will send me a copy. Sleep apnea: Sleep health issues were discussed. Patient will be seen in follow-up appointment in 6 months or earlier if the patient has any concerns    Medication Adjustments/Labs and Tests Ordered: Current medicines are reviewed at length with the patient today.  Concerns regarding medicines are outlined above.  Orders Placed This Encounter  Procedures   EKG 12-Lead   No orders of the defined types were placed in this encounter.    No chief complaint on file.    History of Present Illness:    Jose Clay is a 65 y.o. male.  Patient has past medical history of coronary artery disease, essential hypertension and dyslipidemia.  He has obstructive sleep apnea.  He denies any problems at this time and takes care of activities of daily living.  He has a previous hip issues and therefore cannot ambulate much.  He takes care of activities of daily living.  With sexual activity denies any chest pain.  At the time of my evaluation, the patient is alert awake oriented and in  no distress.  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"   Hypertension    Mixed dyslipidemia 04/10/2020   Myocardial infarction ([place]) 2017   OSA (obstructive sleep apnea)    OSA on CPAP    Pain, abdominal, LUQ 09/16/2020   Pancreatitis, acute 08/22/2015   Preoperative cardiovascular examination 08/12/2016   RUQ abdominal pain 08/22/2015   Screening examination for other arthropod-borne viral diseases 12/04/2019    Past Surgical History:  Procedure Laterality Date   CARDIAC CATHETERIZATION  01/10/2018   JOINT REPLACEMENT     LEFT HEART CATH AND CORONARY ANGIOGRAPHY N/A 01/10/2018   Procedure: LEFT HEART CATH AND CORONARY ANGIOGRAPHY;  Surgeon: Leonie Man, MD;  Location: [place] [facility];  Service: Cardiovascular;  Laterality: N/A;   NASAL SINUS SURGERY  1990s   RECONSTRUCTION URETHROPLASTY  2012 X 5   "S/P fell, straddled a beam"   SHOULDER ARTHROSCOPY W/ ROTATOR CUFF REPAIR Right ~ 2014   SPLENECTOMY  1988   TONSILLECTOMY     TOTAL HIP ARTHROPLASTY Right 07/2015    Current Medications: Current Meds  Medication Sig  albuterol (VENTOLIN HFA) 108 (90 Base) MCG/ACT inhaler Inhale 2 puffs into the lungs as needed for shortness of breath.   amoxicillin-clavulanate (AUGMENTIN) 875-125 MG tablet Take 1 tablet by mouth 2 (two) times daily.   atorvastatin (LIPITOR) 10 MG tablet Take 10 mg by mouth daily.   budesonide-formoterol (SYMBICORT) 160-4.5 MCG/ACT inhaler Inhale 2 puffs into the lungs as needed for wheezing.   EPINEPHrine 0.3 mg/0.3 mL IJ SOAJ injection Inject 0.3 mg into the muscle as needed for anaphylaxis.   esomeprazole (NEXIUM) 20 MG capsule Take 2  capsules (40 mg total) by mouth daily at 12 noon.   gabapentin (NEURONTIN) 600 MG tablet Take 600 mg by mouth 3 (three) times daily.   losartan-hydrochlorothiazide (HYZAAR) 100-25 MG tablet Take 1 tablet by mouth daily.   meloxicam (MOBIC) 7.5 MG tablet Take 7.5 mg by mouth 2 (two) times daily.   nitroGLYCERIN (NITROSTAT) 0.4 MG SL tablet Place 0.4 mg under the tongue every 5 (five) minutes as needed for chest pain.     Allergies:   Bee venom and Shellfish allergy   Social History   Socioeconomic History   Marital status: Married    Spouse name: Not on file   Number of children: Not on file   Years of education: Not on file   Highest education level: Not on file  Occupational History   Not on file  Tobacco Use   Smoking status: Former    Packs/day: 0.50    Years: 2.00    Pack years: 1.00    Types: Cigarettes    Quit date: 66    Years since quitting: 41.8   Smokeless tobacco: Never  Vaping Use   Vaping Use: Never used  Substance and Sexual Activity   Alcohol use: Yes    Alcohol/week: 14.0 standard drinks    Types: 14 Glasses of wine per week    Comment: 01/10/2018 "red wine"   Drug use: Never   Sexual activity: Yes  Other Topics Concern   Not on file  Social History Narrative   Not on file   Social Determinants of Health   Financial Resource Strain: Not on file  Food Insecurity: Not on file  Transportation Needs: Not on file  Physical Activity: Not on file  Stress: Not on file  Social Connections: Not on file     Family History: The patient's family history includes Colon cancer in his father; Diabetes in his mother.  ROS:   Please see the history of present illness.    All other systems reviewed and are negative.  EKGs/Labs/Other Studies Reviewed:    The following studies were reviewed today: I discussed my findings with the patient at length.  EKG reveals sinus rhythm and nonspecific ST changes.   Recent Labs: No results found for requested labs  within last 8760 hours.  Recent Lipid Panel    Component Value Date/Time   CHOL 132 03/22/2018 1033   TRIG 119 03/22/2018 1033   HDL 39 (L) 03/22/2018 1033   CHOLHDL 3.4 03/22/2018 1033   CHOLHDL 4.4 01/10/2018 0929   VLDL 35 01/10/2018 0929   LDLCALC 69 03/22/2018 1033    Physical Exam:    VS:  BP 140/86   Pulse 75   Ht 6' (1.829 m)   Wt 254 lb (115.2 kg)   SpO2 93%   BMI 34.45 kg/m     Wt Readings from Last 3 Encounters:  12/26/20 254 lb (115.2 kg)  09/16/20 259 lb (117.5 kg)  04/10/20 256 lb 1.9 oz (116.2 kg)     GEN: Patient is in no acute distress HEENT: Normal NECK: No JVD; No carotid bruits LYMPHATICS: No lymphadenopathy CARDIAC: Hear sounds regular, 2/6 systolic murmur at the apex. RESPIRATORY:  Clear to auscultation without rales, wheezing or rhonchi  ABDOMEN: Soft, non-tender, non-distended MUSCULOSKELETAL:  No edema; No deformity  SKIN: Warm and dry NEUROLOGIC:  Alert and oriented x 3 PSYCHIATRIC:  Normal affect   Signed, Jenean Lindau, MD  12/26/2020 3:38 PM    [facility]

## 2020-12-26 NOTE — Patient Instructions (Signed)

## 2020-12-29 NOTE — Telephone Encounter (Signed)
Follow up to previous cardiac clearance request. According to most recent cardiac OV, no additional testing or labs had been ordered and no mention of clearance. Will route to Dr. Geraldo Pitter to determine if we may proceed with rescheduling previously canceled EGD.

## 2020-12-29 NOTE — Telephone Encounter (Signed)
[facility] Pre-operative Risk Assessment     JAKHI DISHMAN [birth year] [id number]   Procedure: EGD Anesthesia type:  MAC Procedure Date: 11/05/20 Provider: Dr. Ardis Hughs   Type of Clearance needed: Cardiac  Pt has stress test scheduled 2/22 but did not have completed. Pt no longer having chest pain after COVID per last office visit.

## 2021-01-27 DIAGNOSIS — M25552 Pain in left hip: Secondary | ICD-10-CM

## 2021-01-27 HISTORY — DX: Pain in left hip: M25.552

## 2021-01-28 ENCOUNTER — Telehealth: Payer: Self-pay

## 2021-01-28 ENCOUNTER — Other Ambulatory Visit: Payer: Self-pay | Admitting: Physician Assistant

## 2021-01-28 ENCOUNTER — Other Ambulatory Visit (HOSPITAL_COMMUNITY): Payer: Self-pay | Admitting: Physician Assistant

## 2021-01-28 ENCOUNTER — Telehealth: Payer: Self-pay | Admitting: Cardiology

## 2021-01-28 DIAGNOSIS — Z96649 Presence of unspecified artificial hip joint: Secondary | ICD-10-CM

## 2021-01-28 DIAGNOSIS — M25552 Pain in left hip: Secondary | ICD-10-CM | POA: Diagnosis not present

## 2021-01-28 NOTE — Telephone Encounter (Signed)
° °  Name: Jose Clay  DOB: 1955/08/03  MRN: [id number]   Primary Cardiologist: Jenean Lindau, MD   Chart reviewed as part of pre-operative protocol coverage. Patient was contacted 01/28/2021 in reference to pre-operative risk assessment for pending surgery as outlined below.  Jose Clay was last seen 12/2020 by Dr. Geraldo Pitter, history reviewed. He had a prior heart cath in 2019 showing angiographically minimal CAD with exception of mild mid-distal LAD myocardial bridging. Last echo 05/2019 EF 55-60%, grade 2 DD, mild AI. He was seen in clinic 03/2020 for [facility]. Labwork and stress test were ordered but patient did complete either of these. When seen again in clinic 12/2020 he denied any new problems but was unable to ambulate much due to his hip issues. Revised cardiac risk index is 0.4% based on available data per cardiologist's notes. Given that he is generally unable to complete over 4 METS due to orthopedic issues per notes, will route to MD whether OK to clear for hip surgery if no new symptoms compared to that visit, or if he needs to revisit the stress test previously offered in 03/2020. Dr. Geraldo Pitter. - Please route response to P CV DIV PREOP (the pre-op pool). Thank you.  Charlie Pitter, PA-C 01/28/2021, 3:36 PM

## 2021-01-28 NOTE — Telephone Encounter (Signed)
° °  Pre-operative Risk Assessment    Patient Name: Jose Clay  DOB: 03-19-1955 MRN: [id number]      Request for Surgical Clearance    Procedure:   Left total knee arthroplasty  Date of Surgery:  Clearance TBD                                 Surgeon:  Dr. Gaynelle Arabian Surgeon's Group or Practice Name:  [facility] Phone number:  [phone] Fax number:  [phone] Attention [name]   Type of Clearance Requested:   - Medical    Type of Anesthesia:   Choice   Additional requests/questions:    SignedLowella Grip   01/28/2021, 2:10 PM

## 2021-01-28 NOTE — Telephone Encounter (Signed)
Left message for patient to return call.

## 2021-01-28 NOTE — Telephone Encounter (Signed)
Spoke with [name] from [facility] and she informed me the patient has only seen the PA just this morning.  The patient's procedure will not be for a while as the patient has not seen the provider yet.  She will try and get me a clearance form based on the PA's office visit from today so we can get the ball rolling as she stated.  I thanked her for the call.

## 2021-01-28 NOTE — Telephone Encounter (Signed)
Patient's wife states during patient's appointment on 11/11 Dr. Geraldo Pitter advised the patient to call our office to see if he is cleared for his hip procedure. Please return call to discuss when able.

## 2021-01-28 NOTE — Telephone Encounter (Signed)
Called and spoke with patient's wife per the DPR, Rodman Pickle and she informed me they had just left the providers office of which they will be doing the procedure and they told her to contact our office for clearance information for the patient.  I informed her we would need a surgical clearance form sent from the providers office to complete for the clearance to be provided.  I told her I would call [facility] which is where he would be having a procedure.  I called [facility] and left a message with [name] about the patient's upcoming possible clearance needed.

## 2021-01-29 NOTE — Telephone Encounter (Signed)
Unable to reach or LM. Will try later

## 2021-02-02 NOTE — Telephone Encounter (Signed)
Left message for the pt to call the office to schedule an appt for pre op clearance. Per DR. Revankar pt can be seen by any MD, see notes. Dr. Bettina Gavia in the [facility] office has some openings 02/18/21.

## 2021-02-02 NOTE — Telephone Encounter (Signed)
Will defer pre-op cardiac evaluation to Dr. Geraldo Pitter during follow-up appointment post-stress testing.   Attempts have been made to contact patient to schedule follow-up appointment.  Please continue to attempt to schedule follow-up appointment.   I will remove patient from preoperative cardiac pool at this time.   Lenna Sciara, NP

## 2021-02-02 NOTE — Telephone Encounter (Signed)
Tried calling patient. No answer and no voicemail set up for me to leave a message. 

## 2021-02-10 ENCOUNTER — Encounter (HOSPITAL_COMMUNITY)
Admission: RE | Admit: 2021-02-10 | Discharge: 2021-02-10 | Disposition: A | Discharge status: Home / Self Care | Payer: Medicare Other | Source: Ambulatory Visit | Attending: Physician Assistant | Admitting: Physician Assistant

## 2021-02-10 ENCOUNTER — Other Ambulatory Visit: Payer: Self-pay

## 2021-02-10 ENCOUNTER — Ambulatory Visit (HOSPITAL_COMMUNITY)
Admission: RE | Admit: 2021-02-10 | Discharge: 2021-02-10 | Disposition: A | Discharge status: Home / Self Care | Payer: Medicare Other | Source: Ambulatory Visit | Attending: Physician Assistant | Admitting: Physician Assistant

## 2021-02-10 DIAGNOSIS — M25552 Pain in left hip: Secondary | ICD-10-CM | POA: Diagnosis not present

## 2021-02-10 DIAGNOSIS — Z96641 Presence of right artificial hip joint: Secondary | ICD-10-CM | POA: Diagnosis not present

## 2021-02-10 DIAGNOSIS — Z96649 Presence of unspecified artificial hip joint: Secondary | ICD-10-CM | POA: Insufficient documentation

## 2021-02-10 DIAGNOSIS — M25551 Pain in right hip: Secondary | ICD-10-CM | POA: Diagnosis not present

## 2021-02-10 MED ORDER — TECHNETIUM TC 99M MEDRONATE IV KIT
20.0000 | PACK | Freq: Once | INTRAVENOUS | Status: AC | PRN
  Administered 2021-02-10: 09:00:00 20 via INTRAVENOUS

## 2021-02-11 ENCOUNTER — Ambulatory Visit (HOSPITAL_COMMUNITY): Payer: Medicare Other

## 2021-02-11 ENCOUNTER — Encounter (HOSPITAL_COMMUNITY): Payer: Self-pay

## 2021-02-11 ENCOUNTER — Other Ambulatory Visit (HOSPITAL_COMMUNITY): Payer: Medicare Other

## 2021-02-15 DIAGNOSIS — T7509XA Other effects of lightning, initial encounter: Secondary | ICD-10-CM

## 2021-02-15 HISTORY — DX: Other effects of lightning, initial encounter: T75.09XA

## 2021-02-19 ENCOUNTER — Ambulatory Visit: Payer: Medicare Other | Admitting: Cardiology

## 2021-02-19 ENCOUNTER — Encounter: Payer: Self-pay | Admitting: Cardiology

## 2021-02-19 ENCOUNTER — Other Ambulatory Visit: Payer: Self-pay

## 2021-02-19 VITALS — BP 153/88 | HR 68 | Ht 72.0 in | Wt 256.1 lb

## 2021-02-19 DIAGNOSIS — I2510 Atherosclerotic heart disease of native coronary artery without angina pectoris: Secondary | ICD-10-CM | POA: Diagnosis not present

## 2021-02-19 DIAGNOSIS — Q245 Malformation of coronary vessels: Secondary | ICD-10-CM

## 2021-02-19 DIAGNOSIS — I10 Essential (primary) hypertension: Secondary | ICD-10-CM

## 2021-02-19 DIAGNOSIS — Z01810 Encounter for preprocedural cardiovascular examination: Secondary | ICD-10-CM

## 2021-02-19 DIAGNOSIS — E782 Mixed hyperlipidemia: Secondary | ICD-10-CM

## 2021-02-19 MED ORDER — ASPIRIN EC 81 MG PO TBEC: 81.0000 mg | DELAYED_RELEASE_TABLET | Freq: Every day | ORAL | 3 refills | Status: DC

## 2021-02-19 NOTE — Progress Notes (Signed)
Cardiology Office Note:    Date:  02/19/2021   ID:  Jose Clay, DOB December 22, 1955, MRN [id number]  PCP:  Cyndi Bender, PA-C  Cardiologist:  Shirlee More, MD    Referring MD: Cyndi Bender, PA-C    ASSESSMENT:    1. Preoperative cardiovascular examination   2. Mild CAD   3. Coronary-myocardial bridge   4. Essential hypertension   5. Mixed dyslipidemia    PLAN:    In order of problems listed above:  From a cardiology viewpoint he is optimized for the planned surgical procedure.  The surgery is elective intermediate risk he has mild nonobstructive CAD right bundle branch block an echocardiogram that shows normal left ventricular function and he has not sustained a preceding myocardial infarction I do not think he requires any further preoperative cardiology evaluation if he stays overnight should go to a monitored bed an EKG postoperative day 1.  I did ask him for completeness once he recovers from surgery to start coated aspirin 81 mg daily. He has a coronary myocardial bridge which should the vast majority of individuals is a curiosity and does not reflect diastolic coronary blood flow Stable continue usual antihypertensives Continue with statin with CAD   Next appointment: 1 year with his cardiologist   Medication Adjustments/Labs and Tests Ordered: Current medicines are reviewed at length with the patient today.  Concerns regarding medicines are outlined above.  No orders of the defined types were placed in this encounter.  No orders of the defined types were placed in this encounter.   Chief complaint I need to be seen prior to having total joint arthroplasty   History of Present Illness:    [name] is a 66 y.o. male with a hx of CAD right bundle branch block hypertension hyper lipidemia last seen 12/26/2020. 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 He is compliant with CPAP at nighttime if he stays overnight in the hospital he will need a device He uses his bronchodilators with good result He is not on aspirin I asked him to start once he recovers from his total hip arthroplasty.  Chart review shows a left heart catheterization 01/11/2020 with mild 45% LAD stenosis with associated intramyocardial bridging normal left ventricular ejection fraction recommended medical therapy  Echocardiogram 05/28/2019 showed mild concentric LVH normal ejection fraction right ventricle normal size function and pulmonary artery pressure and no valvular abnormality.  We received a request for preoperative evaluation 01/28/2021 for left total knee arthroplasty and my partner Dr. Geraldo Pitter requested for a new place in my office hours today. 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"   Hypertension    Mixed dyslipidemia 04/10/2020   Myocardial infarction ([place]) 2017   OSA (obstructive sleep apnea)    OSA on CPAP    Pain, abdominal, LUQ 09/16/2020   Pancreatitis, acute 08/22/2015   Preoperative cardiovascular examination 08/12/2016   RUQ abdominal pain 08/22/2015   Screening  examination for other arthropod-borne viral diseases 12/04/2019    Past Surgical History:  Procedure Laterality Date   CARDIAC CATHETERIZATION  01/10/2018   JOINT REPLACEMENT     LEFT HEART CATH AND CORONARY ANGIOGRAPHY N/A 01/10/2018   Procedure: LEFT HEART CATH AND CORONARY ANGIOGRAPHY;  Surgeon: Leonie Man, MD;  Location: [place] [facility];   Service: Cardiovascular;  Laterality: N/A;   NASAL SINUS SURGERY  1990s   RECONSTRUCTION URETHROPLASTY  2012 X 5   "S/P fell, straddled a beam"   SHOULDER ARTHROSCOPY W/ ROTATOR CUFF REPAIR Right ~ 2014   SPLENECTOMY  1988   TONSILLECTOMY     TOTAL HIP ARTHROPLASTY Right 07/2015    Current Medications: Current Meds  Medication Sig   albuterol (VENTOLIN HFA) 108 (90 Base) MCG/ACT inhaler Inhale 2 puffs into the lungs as needed for shortness of breath.   amoxicillin-clavulanate (AUGMENTIN) 875-125 MG tablet Take 1 tablet by mouth 2 (two) times daily.   atorvastatin (LIPITOR) 10 MG tablet Take 10 mg by mouth daily.   budesonide-formoterol (SYMBICORT) 160-4.5 MCG/ACT inhaler Inhale 2 puffs into the lungs as needed for wheezing.   EPINEPHrine 0.3 mg/0.3 mL IJ SOAJ injection Inject 0.3 mg into the muscle as needed for anaphylaxis.   esomeprazole (NEXIUM) 20 MG capsule Take 2 capsules (40 mg total) by mouth daily at 12 noon.   gabapentin (NEURONTIN) 600 MG tablet Take 600 mg by mouth 3 (three) times daily.   losartan-hydrochlorothiazide (HYZAAR) 100-25 MG tablet Take 1 tablet by mouth daily.   meloxicam (MOBIC) 7.5 MG tablet Take 7.5 mg by mouth 2 (two) times daily.   nitroGLYCERIN (NITROSTAT) 0.4 MG SL tablet Place 0.4 mg under the tongue every 5 (five) minutes as needed for chest pain.     Allergies:   Bee venom and Shellfish allergy   Social History   Socioeconomic History   Marital status: Married    Spouse name: Not on file   Number of children: Not on file   Years of education: Not on file   Highest education level: Not on file  Occupational History   Not on file  Tobacco Use   Smoking status: Former    Packs/day: 0.50    Years: 2.00    Pack years: 1.00    Types: Cigarettes    Quit date: 45    Years since quitting: 42.0   Smokeless tobacco: Never  Vaping Use   Vaping Use: Never used  Substance and Sexual Activity   Alcohol use: Yes    Alcohol/week: 14.0 standard  drinks    Types: 14 Glasses of wine per week    Comment: 01/10/2018 "red wine"   Drug use: Never   Sexual activity: Yes  Other Topics Concern   Not on file  Social History Narrative   Not on file   Social Determinants of Health   Financial Resource Strain: Not on file  Food Insecurity: Not on file  Transportation Needs: Not on file  Physical Activity: Not on file  Stress: Not on file  Social Connections: Not on file     Family History: The patient's family history includes Colon cancer in his father; Diabetes in his mother. ROS:   Please see the history of present illness.    All other systems reviewed and are negative.  EKGs/Labs/Other Studies Reviewed:    The following studies were reviewed today:  EKG:  EKG ordered today and personally reviewed.  The ekg ordered today demonstrates sinus rhythm typical right  bundle branch block possible inferior infarction  Recent Labs: No results found for requested labs within last 8760 hours.  Recent Lipid Panel    Component Value Date/Time   CHOL 132 03/22/2018 1033   TRIG 119 03/22/2018 1033   HDL 39 (L) 03/22/2018 1033   CHOLHDL 3.4 03/22/2018 1033   CHOLHDL 4.4 01/10/2018 0929   VLDL 35 01/10/2018 0929   LDLCALC 69 03/22/2018 1033    Physical Exam:    VS:  BP (!) 153/88    Pulse 68    Ht 6' (1.829 m)    Wt 256 lb 1.9 oz (116.2 kg)    SpO2 95%    BMI 34.74 kg/m     Wt Readings from Last 3 Encounters:  02/19/21 256 lb 1.9 oz (116.2 kg)  12/26/20 254 lb (115.2 kg)  09/16/20 259 lb (117.5 kg)     GEN:  Well nourished, well developed in no acute distress HEENT: Normal NECK: No JVD; No carotid bruits LYMPHATICS: No lymphadenopathy CARDIAC: RRR, no murmurs, rubs, gallops RESPIRATORY:  Clear to auscultation without rales, wheezing or rhonchi  ABDOMEN: Soft, non-tender, non-distended MUSCULOSKELETAL:  No edema; No deformity  SKIN: Warm and dry NEUROLOGIC:  Alert and oriented x 3 PSYCHIATRIC:  Normal affect     Signed, Shirlee More, MD  02/19/2021 2:45 PM    [facility]

## 2021-02-19 NOTE — Patient Instructions (Addendum)
Medication Instructions:  Your physician has recommended you make the following change in your medication:  START: Aspirin 81 mg take one tablet by mouth daily.  *If you need a refill on your cardiac medications before your next appointment, please call your pharmacy*   Lab Work: None If you have labs (blood work) drawn today and your tests are completely normal, you will receive your results only by: [place] (if you have MyChart) OR A paper copy in the mail If you have any lab test that is abnormal or we need to change your treatment, we will call you to review the results.   Testing/Procedures: None   Follow-Up: At [facility], you and your health needs are our priority.  As part of our continuing mission to provide you with exceptional heart care, we have created designated Provider Care Teams.  These Care Teams include your primary Cardiologist (physician) and Advanced Practice Providers (APPs -  Physician Assistants and Nurse Practitioners) who all work together to provide you with the care you need, when you need it.  We recommend signing up for the patient portal called "MyChart".  Sign up information is provided on this After Visit Summary.  MyChart is used to connect with patients for Virtual Visits (Telemedicine).  Patients are able to view lab/test results, encounter notes, upcoming appointments, etc.  Non-urgent messages can be sent to your provider as well.   To learn more about what you can do with MyChart, go to [email or web address].    Your next appointment:   1 year(s)  The format for your next appointment:   In Person  Provider:  Dr. Geraldo Pitter   Other Instructions

## 2021-02-19 NOTE — Progress Notes (Deleted)
Cardiology Office Note:    Date:  02/19/2021   ID:  Jose Clay, DOB [birth year], MRN [id number]  PCP:  Jose Bender, PA-C  Cardiologist:  Jose More, MD    Referring MD: Jose Bender, PA-C    ASSESSMENT:    No diagnosis found. PLAN:    In order of problems listed above:  ***   Next appointment: ***   Medication Adjustments/Labs and Tests Ordered: Current medicines are reviewed at length with the patient today.  Concerns regarding medicines are outlined above.  No orders of the defined types were placed in this encounter.  No orders of the defined types were placed in this encounter.   No chief complaint on file.   History of Present Illness:    Jose Clay is a 66 y.o. male with a hx of CAD hypertension hyper lipidemia last seen 12/26/2020. Compliance with diet, lifestyle and medications: ***  Chart review shows a left heart catheterization 01/11/2020 with mild 45% LAD stenosis with associated intramyocardial bridging normal left ventricular ejection fraction recommended medical therapy  Echocardiogram 05/28/2019 showed mild concentric LVH normal ejection fraction right ventricle normal size function and pulmonary artery pressure and no valvular abnormality.  We received a request for preoperative evaluation 01/28/2021 for left total knee arthroplasty and my partner Dr. Geraldo Clay requested for a new place in my office hours today. 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"   Hypertension    Mixed dyslipidemia 04/10/2020   Myocardial infarction ([facility]) 2017   OSA (obstructive sleep apnea)    OSA on CPAP    Pain, abdominal, LUQ 09/16/2020    Pancreatitis, acute 08/22/2015   Preoperative cardiovascular examination 08/12/2016   RUQ abdominal pain 08/22/2015   Screening examination for other arthropod-borne viral diseases 12/04/2019    Past Surgical History:  Procedure Laterality Date   CARDIAC CATHETERIZATION  01/10/2018   JOINT REPLACEMENT     LEFT HEART CATH AND CORONARY ANGIOGRAPHY N/A 01/10/2018   Procedure: LEFT HEART CATH AND CORONARY ANGIOGRAPHY;  Surgeon: Leonie Man, MD;  Location: [facility];  Service: Cardiovascular;  Laterality: N/A;   NASAL SINUS SURGERY  1990s   RECONSTRUCTION URETHROPLASTY  2012 X 5   "S/P fell, straddled a beam"   SHOULDER ARTHROSCOPY W/ ROTATOR CUFF REPAIR Right ~ 2014   SPLENECTOMY  1988   TONSILLECTOMY     TOTAL HIP ARTHROPLASTY Right 07/2015    Current Medications: No outpatient medications have been marked as taking for the 02/19/21 encounter (Appointment) with Jose Priest, MD.     Allergies:   Bee venom and Shellfish allergy   Social History   Socioeconomic History   Marital status: Married    Spouse name: Not on file   Number of children: Not on file   Years of education: Not on file   Highest education level: Not on file  Occupational History   Not on file  Tobacco Use   Smoking status: Former    Packs/day: 0.50    Years: 2.00    Pack years: 1.00    Types: Cigarettes    Quit date: 51  Years since quitting: 42.0   Smokeless tobacco: Never  Vaping Use   Vaping Use: Never used  Substance and Sexual Activity   Alcohol use: Yes    Alcohol/week: 14.0 standard drinks    Types: 14 Glasses of wine per week    Comment: 01/10/2018 "red wine"   Drug use: Never   Sexual activity: Yes  Other Topics Concern   Not on file  Social History Narrative   Not on file   Social Determinants of Health   Financial Resource Strain: Not on file  Food Insecurity: Not on file  Transportation Needs: Not on file  Physical Activity: Not on file  Stress: Not on file   Social Connections: Not on file     Family History: The patient's ***family history includes Colon cancer in his father; Diabetes in his mother. ROS:   Please see the history of present illness.    All other systems reviewed and are negative.  EKGs/Labs/Other Studies Reviewed:    The following studies were reviewed today:  EKG:  EKG ordered today and personally reviewed.  The ekg ordered today demonstrates ***  Recent Labs: No results found for requested labs within last 8760 hours.  Recent Lipid Panel    Component Value Date/Time   CHOL 132 03/22/2018 1033   TRIG 119 03/22/2018 1033   HDL 39 (L) 03/22/2018 1033   CHOLHDL 3.4 03/22/2018 1033   CHOLHDL 4.4 01/10/2018 0929   VLDL 35 01/10/2018 0929   LDLCALC 69 03/22/2018 1033    Physical Exam:    VS:  There were no vitals taken for this visit.    Wt Readings from Last 3 Encounters:  12/26/20 254 lb (115.2 kg)  09/16/20 259 lb (117.5 kg)  04/10/20 256 lb 1.9 oz (116.2 kg)     GEN: *** Well nourished, well developed in no acute distress HEENT: Normal NECK: No JVD; No carotid bruits LYMPHATICS: No lymphadenopathy CARDIAC: ***RRR, no murmurs, rubs, gallops RESPIRATORY:  Clear to auscultation without rales, wheezing or rhonchi  ABDOMEN: Soft, non-tender, non-distended MUSCULOSKELETAL:  No edema; No deformity  SKIN: Warm and dry NEUROLOGIC:  Alert and oriented x 3 PSYCHIATRIC:  Normal affect    Signed, Jose More, MD  02/19/2021 8:23 AM    [place]

## 2021-03-08 ENCOUNTER — Other Ambulatory Visit: Payer: Self-pay | Admitting: Cardiology

## 2021-03-11 DIAGNOSIS — M25552 Pain in left hip: Secondary | ICD-10-CM | POA: Diagnosis not present

## 2021-03-11 DIAGNOSIS — M1612 Unilateral primary osteoarthritis, left hip: Secondary | ICD-10-CM | POA: Diagnosis not present

## 2021-04-03 DIAGNOSIS — M1612 Unilateral primary osteoarthritis, left hip: Secondary | ICD-10-CM | POA: Diagnosis not present

## 2021-04-03 DIAGNOSIS — M26629 Arthralgia of temporomandibular joint, unspecified side: Secondary | ICD-10-CM | POA: Diagnosis not present

## 2021-04-03 DIAGNOSIS — I2510 Atherosclerotic heart disease of native coronary artery without angina pectoris: Secondary | ICD-10-CM | POA: Diagnosis not present

## 2021-04-03 DIAGNOSIS — R7303 Prediabetes: Secondary | ICD-10-CM | POA: Diagnosis not present

## 2021-04-06 ENCOUNTER — Other Ambulatory Visit: Payer: Self-pay | Admitting: Cardiology

## 2021-04-06 DIAGNOSIS — E782 Mixed hyperlipidemia: Secondary | ICD-10-CM

## 2021-04-06 DIAGNOSIS — I2510 Atherosclerotic heart disease of native coronary artery without angina pectoris: Secondary | ICD-10-CM

## 2021-04-06 DIAGNOSIS — R079 Chest pain, unspecified: Secondary | ICD-10-CM

## 2021-04-06 DIAGNOSIS — I10 Essential (primary) hypertension: Secondary | ICD-10-CM

## 2021-04-06 DIAGNOSIS — G4733 Obstructive sleep apnea (adult) (pediatric): Secondary | ICD-10-CM

## 2021-06-16 DIAGNOSIS — Z139 Encounter for screening, unspecified: Secondary | ICD-10-CM | POA: Diagnosis not present

## 2021-06-16 DIAGNOSIS — Z01818 Encounter for other preprocedural examination: Secondary | ICD-10-CM | POA: Diagnosis not present

## 2021-06-16 DIAGNOSIS — T7500XA Unspecified effects of lightning, initial encounter: Secondary | ICD-10-CM | POA: Diagnosis not present

## 2021-06-16 DIAGNOSIS — Z9181 History of falling: Secondary | ICD-10-CM | POA: Diagnosis not present

## 2021-06-25 NOTE — H&P (Addendum)
TOTAL HIP ADMISSION H&P ? ?Patient is admitted for left total hip arthroplasty. ? ?Subjective: ? ?Chief Complaint: Left hip pain ? ?HPI: Jose Clay, 66 y.o. male, has a history of pain and functional disability in the left hip due to arthritis and patient has failed non-surgical conservative treatments for greater than 12 weeks to include NSAID's and/or analgesics, weight reduction as appropriate, and activity modification. Onset of symptoms was gradual, starting  several  years ago with gradually worsening course since that time. The patient noted no past surgery on the left hip. Patient currently rates pain in the left hip at 8 out of 10 with activity. Patient has night pain, worsening of pain with activity and weight bearing, and pain that interfers with activities of daily living. 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 He has a subchondral fracture and mild collapse  by imaging studies. This condition presents safety issues increasing the risk of falls. There is no current active infection. ? ?Patient Active Problem List  ? Diagnosis Date Noted  ? Pain, abdominal, LUQ 09/16/2020  ? Mixed dyslipidemia 04/10/2020  ? Asthma   ? GERD (gastroesophageal reflux disease)   ? Hypertension   ? OSA on CPAP   ? Anal fissure 12/04/2019  ? Anal pain 12/04/2019  ? Screening examination for other arthropod-borne viral diseases 12/04/2019  ? CAD (coronary artery disease) 11/01/2018  ? OSA (obstructive sleep apnea)   ? Essential hypertension   ? Abnormal nuclear stress test 09/22/2017  ? Abnormal nuclear cardiac imaging test 08/13/2016  ? Preoperative cardiovascular examination 08/12/2016  ? RUQ abdominal pain 08/22/2015  ? Pancreatitis, acute 08/22/2015  ? Myocardial infarction [facility]) 2017  ? History of blood transfusion 1988  ? ? ?Past Medical History:  ?Diagnosis Date  ? Abnormal nuclear cardiac imaging test 08/13/2016  ? Abnormal nuclear stress test 09/22/2017   ? Anal fissure 12/04/2019  ? Anal pain 12/04/2019  ? Asthma   ? CAD (coronary artery disease) 11/01/2018  ? Essential hypertension   ? GERD (gastroesophageal reflux disease)   ? History of blood transfusion 1988  ? "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"  ? Hypertension   ? Mixed dyslipidemia 04/10/2020  ? Myocardial infarction [facility]) 2017  ? OSA (obstructive sleep apnea)   ? OSA on CPAP   ? Pain, abdominal, LUQ 09/16/2020  ? Pancreatitis, acute 08/22/2015  ? Preoperative cardiovascular examination 08/12/2016  ? RUQ abdominal pain 08/22/2015  ? Screening examination for other arthropod-borne viral diseases 12/04/2019  ? ? ?Past Surgical History:  ?Procedure Laterality Date  ? CARDIAC CATHETERIZATION  01/10/2018  ? JOINT REPLACEMENT    ? LEFT HEART CATH AND CORONARY ANGIOGRAPHY N/A 01/10/2018  ? Procedure: LEFT HEART CATH AND CORONARY ANGIOGRAPHY;  Surgeon: Leonie Man, MD;  Location: [place] [facility];  Service: Cardiovascular;  Laterality: N/A;  ? NASAL SINUS SURGERY  1990s  ? RECONSTRUCTION URETHROPLASTY  2012 X 5  ? "S/P fell, straddled a beam"  ? SHOULDER ARTHROSCOPY W/ ROTATOR CUFF REPAIR Right ~ 2014  ? SPLENECTOMY  1988  ? TONSILLECTOMY    ? TOTAL HIP ARTHROPLASTY Right 07/2015  ? ? ?Prior to Admission medications   ?Medication Sig Start Date End Date Taking? Authorizing Provider  ?albuterol (VENTOLIN HFA) 108 (90 Base) MCG/ACT inhaler Inhale 2 puffs into the lungs as needed for shortness of breath.    [provider]  ?amoxicillin-clavulanate (AUGMENTIN) 875-125 MG tablet Take 1 tablet by  mouth 2 (two) times daily. 12/23/20   [provider]  ?aspirin EC 81 MG tablet Take 1 tablet (81 mg total) by mouth daily. Swallow whole. 02/19/21   Richardo Priest, MD  ?atorvastatin (LIPITOR) 10 MG tablet TAKE 1 TABLET BY MOUTH EVERY DAY 03/09/21   Revankar, Reita Cliche, MD  ?budesonide-formoterol [facility]) 160-4.5 MCG/ACT inhaler Inhale 2 puffs into the lungs as needed for wheezing. 11/06/19   [provider]   ?EPINEPHrine 0.3 mg/0.3 mL IJ SOAJ injection Inject 0.3 mg into the muscle as needed for anaphylaxis.    [provider]  ?esomeprazole (NEXIUM) 20 MG capsule Take 2 capsules (40 mg total) by mouth daily at 12 noon. 09/16/20   Zehr, Laban Emperor, PA-C  ?gabapentin (NEURONTIN) 600 MG tablet Take 600 mg by mouth 3 (three) times daily.    [provider]  ?losartan-hydrochlorothiazide (HYZAAR) 100-25 MG tablet Take 1 tablet by mouth daily. 12/03/19   [provider]  ?meloxicam (MOBIC) 7.5 MG tablet Take 7.5 mg by mouth 2 (two) times daily. 12/23/20   [provider]  ?nitroGLYCERIN (NITROSTAT) 0.4 MG SL tablet Place 0.4 mg under the tongue every 5 (five) minutes as needed for chest pain.    [provider]  ? ? ?Allergies  ?Allergen Reactions  ? Bee Venom Swelling  ? Shellfish Allergy Shortness Of Breath, Swelling and Rash  ?  Oyster, scallops  ?  ? ? ?Social History  ? ?Socioeconomic History  ? Marital status: Married  ?  Spouse name: Not on file  ? Number of children: Not on file  ? Years of education: Not on file  ? Highest education level: Not on file  ?Occupational History  ? Not on file  ?Tobacco Use  ? Smoking status: Former  ?  Packs/day: 0.50  ?  Years: 2.00  ?  Pack years: 1.00  ?  Types: Cigarettes  ?  Quit date: 55  ?  Years since quitting: 42.3  ? Smokeless tobacco: Never  ?Vaping Use  ? Vaping Use: Never used  ?Substance and Sexual Activity  ? Alcohol use: Yes  ?  Alcohol/week: 14.0 standard drinks  ?  Types: 14 Glasses of wine per week  ?  Comment: 01/10/2018 "red wine"  ? Drug use: Never  ? Sexual activity: Yes  ?Other Topics Concern  ? Not on file  ?Social History Narrative  ? Not on file  ? ?Social Determinants of Health  ? ?Financial Resource Strain: Not on file  ?Food Insecurity: Not on file  ?Transportation Needs: Not on file  ?Physical Activity: Not on file  ?Stress: Not on file  ?Social Connections: Not on file  ?Intimate Partner Violence: Not on file   ? ? ?Tobacco Use: Medium Risk  ? Smoking Tobacco Use: Former  ? Smokeless Tobacco Use: Never  ? Passive Exposure: Not on file  ? ?Social History  ? ?Substance and Sexual Activity  ?Alcohol Use Yes  ? Alcohol/week: 14.0 standard drinks  ? Types: 14 Glasses of wine per week  ? Comment: 01/10/2018 "red wine"  ? ? ?Family History  ?Problem Relation Age of Onset  ? Colon cancer Father   ? Diabetes Mother   ? ? ?Review of Systems  ?Constitutional:  Negative for chills and fever.  ?HENT: Negative.    ?Eyes: Negative.   ?Respiratory:  Negative for cough and shortness of breath.   ?Cardiovascular:  Negative for chest pain and palpitations.  ?Gastrointestinal:  Negative for abdominal pain,  constipation, diarrhea, nausea and vomiting.  ?Genitourinary:  Negative for dysuria, frequency and urgency.  ?Musculoskeletal:  Positive for joint pain.  ?Skin:  Negative for rash.  ? ? ?Objective: ? ?Physical Exam: ?Well nourished and well developed.  ?General: Alert and oriented x3, cooperative and pleasant, no acute distress.  ?Head: normocephalic, atraumatic, neck supple.  ?Eyes: EOMI.  ?Abdomen: non-tender to palpation and soft, normoactive bowel sounds. ?Musculoskeletal: ?The patient has a significantly antalgic gait pattern favoring the left side without the use of assistive devices.  ? ?Right Hip Exam:  ? The range of motion: Flexion to 100 degrees, Internal Rotation to 20 degrees, External Rotation to 30 degrees, and abduction to 30 degrees without discomfort.  ? There is no tenderness over the greater trochanteric bursa.  ? ?Left Hip Exam:  ? The range of motion: Flexion to 100 degrees, Internal Rotation is minimal, External Rotation to 10 to 20 degrees, and abduction to 20 degrees with pain on range of motion.  ? There is no tenderness over the greater trochanteric bursa. ? ?Calves soft and nontender. Motor function intact in LE. Strength 5/5 LE bilaterally. ?Neuro: Distal pulses 2+. Sensation to light touch intact in  LE. ? ?Vital signs in last 24 hours: ?BP: ()/()  ?Arterial Line BP: ()/()  ? ?Imaging Review ?Plain radiographs demonstrate severe degenerative joint disease of the left hip. The bone quality appears to be adequate for a

## 2021-07-01 ENCOUNTER — Ambulatory Visit: Payer: Medicare Other | Admitting: Cardiology
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
## 2021-07-01 NOTE — Patient Instructions (Addendum)
DUE TO COVID-19 ONLY TWO VISITORS  (aged 66 and older)  IS ALLOWED TO COME WITH YOU AND STAY IN THE WAITING ROOM ONLY DURING PRE OP AND PROCEDURE.   **NO VISITORS ARE ALLOWED IN THE SHORT STAY AREA OR RECOVERY ROOM!!**  IF YOU WILL BE ADMITTED INTO THE HOSPITAL YOU ARE ALLOWED ONLY FOUR SUPPORT PEOPLE DURING VISITATION HOURS ONLY (7 AM -8PM)   The support person(s) must pass our screening, gel in and out Visitors GUEST BADGE MUST BE WORN VISIBLY  One adult visitor may remain with you overnight and MUST be in the room by 8 P.M.   You are not required to [employer] often Do NOT share personal items Notify your provider if you are in close contact with someone who has COVID or you develop fever 100.4 or greater, new onset of sneezing, cough, sore throat, shortness of breath or body aches.        Your procedure is scheduled on:  07-15-21   Report to [facility] Main Entrance    Report to admitting at 9:45 AM   Call this number if you have problems the morning of surgery [phone]   Do not eat food :After Midnight.   After Midnight you may have the following liquids until  9:30 AM DAY OF SURGERY  Water Black Coffee (sugar ok, NO MILK/CREAM OR CREAMERS)  Tea (sugar ok, NO MILK/CREAM OR CREAMERS) regular and decaf                             Plain Jell-O (NO RED)                                           Fruit ices (not with fruit pulp, NO RED)                                     Popsicles (NO RED)                                                                  Juice: apple, WHITE grape, WHITE cranberry Sports drinks like Gatorade (NO RED) Clear broth(vegetable,chicken,beef)                   If you have questions, please contact your surgeon's office.   FOLLOW  ANY ADDITIONAL PRE OP INSTRUCTIONS YOU RECEIVED FROM YOUR SURGEON'S OFFICE!!!     Oral Hygiene is also important to reduce your risk of infection.                                    Remember -  BRUSH YOUR TEETH THE MORNING OF SURGERY WITH YOUR REGULAR TOOTHPASTE   Do NOT smoke after Midnight  Take these medicines the morning of surgery with A SIP OF WATER: Atorvasstatin, Nexium, Gabapentin.  Okay to use inhalers (bring with you day of surgery)  Bring CPAP mask and tubing day of surgery.  You may not have any metal on your body including jewelry, and body piercing             Do not wear lotions, powders, cologne, or deodorant              Men may shave face and neck.   Do not bring valuables to the hospital. [place].   Contacts, dentures or bridgework may not be worn into surgery.   Bring small overnight bag day of surgery.   Please read over the following fact sheets you were given: IF YOU HAVE QUESTIONS ABOUT YOUR PRE-OP INSTRUCTIONS PLEASE CALL [place] - Preparing for Surgery Before surgery, you can play an important role.  Because skin is not sterile, your skin needs to be as free of germs as possible.  You can reduce the number of germs on your skin by washing with CHG (chlorahexidine gluconate) soap before surgery.  CHG is an antiseptic cleaner which kills germs and bonds with the skin to continue killing germs even after washing. Please DO NOT use if you have an allergy to CHG or antibacterial soaps.  If your skin becomes reddened/irritated stop using the CHG and inform your nurse when you arrive at Short Stay. Do not shave (including legs and underarms) for at least 48 hours prior to the first CHG shower.  You may shave your face/neck.  Please follow these instructions carefully:  1.  Shower with CHG Soap the night before surgery and the  morning of surgery.  2.  If you choose to wash your hair, wash your hair first as usual with your normal  shampoo.  3.  After you shampoo, rinse your hair and body thoroughly to remove the shampoo.                             4.  Use CHG as you  would any other liquid soap.  You can apply chg directly to the skin and wash.  Gently with a scrungie or clean washcloth.  5.  Apply the CHG Soap to your body ONLY FROM THE NECK DOWN.   Do   not use on face/ open                           Wound or open sores. Avoid contact with eyes, ears mouth and   genitals (private parts).                       Wash face,  Genitals (private parts) with your normal soap.             6.  Wash thoroughly, paying special attention to the area where your    surgery  will be performed.  7.  Thoroughly rinse your body with warm water from the neck down.  8.  DO NOT shower/wash with your normal soap after using and rinsing off the CHG Soap.                9.  Pat yourself dry with a clean towel.            10.  Wear clean pajamas.            11.  Place clean sheets on your bed the night of your first shower and do not  sleep  with pets. Day of Surgery : Do not apply any lotions/deodorants the morning of surgery.  Please wear clean clothes to the hospital/surgery center.  FAILURE TO FOLLOW THESE INSTRUCTIONS MAY RESULT IN THE CANCELLATION OF YOUR SURGERY  PATIENT SIGNATURE_________________________________  NURSE SIGNATURE__________________________________  ________________________________________________________________________     Jose Clay  An incentive spirometer is a tool that can help keep your lungs clear and active. This tool measures how well you are filling your lungs with each breath. Taking long deep breaths may help reverse or decrease the chance of developing breathing (pulmonary) problems (especially infection) following: A long period of time when you are unable to move or be active. BEFORE THE PROCEDURE  If the spirometer includes an indicator to show your best effort, your nurse or respiratory therapist will set it to a desired goal. If possible, sit up straight or lean slightly forward. Try not to slouch. Hold the incentive  spirometer in an upright position. INSTRUCTIONS FOR USE  Sit on the edge of your bed if possible, or sit up as far as you can in bed or on a chair. Hold the incentive spirometer in an upright position. Breathe out normally. Place the mouthpiece in your mouth and seal your lips tightly around it. Breathe in slowly and as deeply as possible, raising the piston or the ball toward the top of the column. Hold your breath for 3-5 seconds or for as long as possible. Allow the piston or ball to fall to the bottom of the column. Remove the mouthpiece from your mouth and breathe out normally. Rest for a few seconds and repeat Steps 1 through 7 at least 10 times every 1-2 hours when you are awake. Take your time and take a few normal breaths between deep breaths. The spirometer may include an indicator to show your best effort. Use the indicator as a goal to work toward during each repetition. After each set of 10 deep breaths, practice coughing to be sure your lungs are clear. If you have an incision (the cut made at the time of surgery), support your incision when coughing by placing a pillow or rolled up towels firmly against it. Once you are able to get out of bed, walk around indoors and cough well. You may stop using the incentive spirometer when instructed by your caregiver.  RISKS AND COMPLICATIONS Take your time so you do not get dizzy or light-headed. If you are in pain, you may need to take or ask for pain medication before doing incentive spirometry. It is harder to take a deep breath if you are having pain. AFTER USE Rest and breathe slowly and easily. It can be helpful to keep track of a log of your progress. Your caregiver can provide you with a simple table to help with this. If you are using the spirometer at home, follow these instructions: [place] IF:  You are having difficultly using the spirometer. You have trouble using the spirometer as often as instructed. Your pain  medication is not giving enough relief while using the spirometer. You develop fever of 100.5 F (38.1 C) or higher. SEEK IMMEDIATE MEDICAL CARE IF:  You cough up bloody sputum that had not been present before. You develop fever of 102 F (38.9 C) or greater. You develop worsening pain at or near the incision site. MAKE SURE YOU:  Understand these instructions. Will watch your condition. Will get help right away if you are not doing well or get worse. Document Released: 06/14/2006  Document Revised: 04/26/2011 Document Reviewed: 08/15/2006 ExitCare Patient Information 2014 ExitCare, [place].   ________________________________________________________________________  WHAT IS A BLOOD TRANSFUSION? Blood Transfusion Information  A transfusion is the replacement of blood or some of its parts. Blood is made up of multiple cells which provide different functions. Red blood cells carry oxygen and are used for blood loss replacement. White blood cells fight against infection. Platelets control bleeding. Plasma helps clot blood. Other blood products are available for specialized needs, such as hemophilia or other clotting disorders. BEFORE THE TRANSFUSION  Who gives blood for transfusions?  Healthy volunteers who are fully evaluated to make sure their blood is safe. This is blood bank blood. Transfusion therapy is the safest it has ever been in the practice of medicine. Before blood is taken from a donor, a complete history is taken to make sure that person has no history of diseases nor engages in risky social behavior (examples are intravenous drug use or sexual activity with multiple partners). The donor's travel history is screened to minimize risk of transmitting infections, such as malaria. The donated blood is tested for signs of infectious diseases, such as HIV and hepatitis. The blood is then tested to be sure it is compatible with you in order to minimize the chance of a transfusion  reaction. If you or a relative donates blood, this is often done in anticipation of surgery and is not appropriate for emergency situations. It takes many days to process the donated blood. RISKS AND COMPLICATIONS Although transfusion therapy is very safe and saves many lives, the main dangers of transfusion include:  Getting an infectious disease. Developing a transfusion reaction. This is an allergic reaction to something in the blood you were given. Every precaution is taken to prevent this. The decision to have a blood transfusion has been considered carefully by your caregiver before blood is given. Blood is not given unless the benefits outweigh the risks. AFTER THE TRANSFUSION Right after receiving a blood transfusion, you will usually feel much better and more energetic. This is especially true if your red blood cells have gotten low (anemic). The transfusion raises the level of the red blood cells which carry oxygen, and this usually causes an energy increase. The nurse administering the transfusion will monitor you carefully for complications. HOME CARE INSTRUCTIONS  No special instructions are needed after a transfusion. You may find your energy is better. Speak with your caregiver about any limitations on activity for underlying diseases you may have. SEEK MEDICAL CARE IF:  Your condition is not improving after your transfusion. You develop redness or irritation at the intravenous (IV) site. SEEK IMMEDIATE MEDICAL CARE IF:  Any of the following symptoms occur over the next 12 hours: Shaking chills. You have a temperature by mouth above 102 F (38.9 C), not controlled by medicine. Chest, back, or muscle pain. People around you feel you are not acting correctly or are confused. Shortness of breath or difficulty breathing. Dizziness and fainting. You get a rash or develop hives. You have a decrease in urine output. Your urine turns a dark color or changes to pink, red, or  brown. Any of the following symptoms occur over the next 10 days: You have a temperature by mouth above 102 F (38.9 C), not controlled by medicine. Shortness of breath. Weakness after normal activity. The white part of the eye turns yellow (jaundice). You have a decrease in the amount of urine or are urinating less often. Your urine turns a dark color or  changes to pink, red, or brown. Document Released: 01/30/2000 Document Revised: 04/26/2011 Document Reviewed: 09/18/2007 [facility] Patient Information 2014 [place], [place].  _______________________________________________________________________

## 2021-07-02 ENCOUNTER — Encounter (HOSPITAL_COMMUNITY)
Admission: RE | Admit: 2021-07-02 | Discharge: 2021-07-02 | Disposition: A | Discharge status: Home / Self Care | Payer: Medicare Other | Source: Ambulatory Visit | Attending: Orthopedic Surgery | Admitting: Orthopedic Surgery

## 2021-07-02 ENCOUNTER — Encounter (HOSPITAL_COMMUNITY): Payer: Self-pay

## 2021-07-02 ENCOUNTER — Other Ambulatory Visit: Payer: Self-pay

## 2021-07-02 VITALS — BP 141/83 | HR 63 | Temp 98.0°F | Resp 18 | Ht 72.0 in | Wt 251.6 lb

## 2021-07-02 DIAGNOSIS — Z01818 Encounter for other preprocedural examination: Secondary | ICD-10-CM

## 2021-07-02 DIAGNOSIS — R7303 Prediabetes: Secondary | ICD-10-CM | POA: Insufficient documentation

## 2021-07-02 DIAGNOSIS — M1612 Unilateral primary osteoarthritis, left hip: Secondary | ICD-10-CM | POA: Diagnosis not present

## 2021-07-02 DIAGNOSIS — Z01812 Encounter for preprocedural laboratory examination: Secondary | ICD-10-CM | POA: Diagnosis not present

## 2021-07-02 HISTORY — DX: Prediabetes: R73.03

## 2021-07-02 HISTORY — DX: Unspecified osteoarthritis, unspecified site: M19.90

## 2021-07-02 HISTORY — DX: Pneumonia, unspecified organism: J18.9

## 2021-07-02 LAB — TYPE AND SCREEN
ABO/RH(D): A POS
Antibody Screen: NEGATIVE

## 2021-07-02 LAB — SURGICAL PCR SCREEN
MRSA, PCR: NEGATIVE
Staphylococcus aureus: POSITIVE — AB

## 2021-07-02 LAB — GLUCOSE, CAPILLARY: Glucose-Capillary: 132 mg/dL — ABNORMAL HIGH (ref 70–99)

## 2021-07-06 NOTE — Anesthesia Preprocedure Evaluation (Addendum)
Anesthesia Evaluation  Patient identified by MRN, date of birth, ID band Patient awake    Reviewed: Allergy & Precautions, NPO status , Patient's Chart, lab work & pertinent test results  Airway Mallampati: II  TM Distance: >3 FB     Dental   Pulmonary asthma , sleep apnea , pneumonia, former smoker,    breath sounds clear to auscultation       Cardiovascular hypertension, + CAD and + Past MI   Rhythm:Regular Rate:Normal     Neuro/Psych    GI/Hepatic Neg liver ROS, GERD  ,  Endo/Other  negative endocrine ROS  Renal/GU negative Renal ROS     Musculoskeletal   Abdominal   Peds  Hematology   Anesthesia Other Findings   Reproductive/Obstetrics                            Anesthesia Physical Anesthesia Plan  ASA: 3  Anesthesia Plan: General   Post-op Pain Management:    Induction: Intravenous  PONV Risk Score and Plan: 3 and Ondansetron, Midazolam and Dexamethasone  Airway Management Planned: Oral ETT  Additional Equipment:   Intra-op Plan:   Post-operative Plan: Extubation in OR  Informed Consent: I have reviewed the patients History and Physical, chart, labs and discussed the procedure including the risks, benefits and alternatives for the proposed anesthesia with the patient or authorized representative who has indicated his/her understanding and acceptance.     Dental advisory given  Plan Discussed with: CRNA and Anesthesiologist  Anesthesia Plan Comments: (See PAT note 07/02/2021)      Anesthesia Quick Evaluation
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
## 2021-07-09 DIAGNOSIS — G4733 Obstructive sleep apnea (adult) (pediatric): Secondary | ICD-10-CM | POA: Diagnosis not present

## 2021-07-15 ENCOUNTER — Other Ambulatory Visit: Payer: Self-pay

## 2021-07-15 ENCOUNTER — Ambulatory Visit (HOSPITAL_COMMUNITY): Payer: Medicare Other | Admitting: Physician Assistant

## 2021-07-15 ENCOUNTER — Encounter (HOSPITAL_COMMUNITY)
Admission: RE | Disposition: A | Discharge status: Home / Self Care | Payer: Self-pay | Source: Home / Self Care | Attending: Orthopedic Surgery

## 2021-07-15 ENCOUNTER — Encounter (HOSPITAL_COMMUNITY): Payer: Self-pay | Admitting: Orthopedic Surgery

## 2021-07-15 ENCOUNTER — Observation Stay (HOSPITAL_COMMUNITY)
Admission: RE | Admit: 2021-07-15 | Discharge: 2021-07-16 | Disposition: A | Discharge status: Home / Self Care | Payer: Medicare Other | Attending: Orthopedic Surgery | Admitting: Orthopedic Surgery

## 2021-07-15 ENCOUNTER — Observation Stay (HOSPITAL_COMMUNITY): Payer: Medicare Other

## 2021-07-15 ENCOUNTER — Ambulatory Visit (HOSPITAL_BASED_OUTPATIENT_CLINIC_OR_DEPARTMENT_OTHER): Payer: Medicare Other | Admitting: Anesthesiology

## 2021-07-15 DIAGNOSIS — M1612 Unilateral primary osteoarthritis, left hip: Principal | ICD-10-CM | POA: Insufficient documentation

## 2021-07-15 DIAGNOSIS — Z79899 Other long term (current) drug therapy: Secondary | ICD-10-CM | POA: Insufficient documentation

## 2021-07-15 DIAGNOSIS — Z96642 Presence of left artificial hip joint: Secondary | ICD-10-CM | POA: Diagnosis not present

## 2021-07-15 DIAGNOSIS — J45909 Unspecified asthma, uncomplicated: Secondary | ICD-10-CM | POA: Diagnosis not present

## 2021-07-15 DIAGNOSIS — I252 Old myocardial infarction: Secondary | ICD-10-CM | POA: Diagnosis not present

## 2021-07-15 DIAGNOSIS — Z7982 Long term (current) use of aspirin: Secondary | ICD-10-CM | POA: Diagnosis not present

## 2021-07-15 DIAGNOSIS — T884XXA Failed or difficult intubation, initial encounter: Secondary | ICD-10-CM

## 2021-07-15 DIAGNOSIS — Y829 Unspecified medical devices associated with adverse incidents: Secondary | ICD-10-CM | POA: Diagnosis not present

## 2021-07-15 DIAGNOSIS — I10 Essential (primary) hypertension: Secondary | ICD-10-CM | POA: Insufficient documentation

## 2021-07-15 DIAGNOSIS — Z87891 Personal history of nicotine dependence: Secondary | ICD-10-CM | POA: Diagnosis not present

## 2021-07-15 DIAGNOSIS — M169 Osteoarthritis of hip, unspecified: Secondary | ICD-10-CM

## 2021-07-15 DIAGNOSIS — I2510 Atherosclerotic heart disease of native coronary artery without angina pectoris: Secondary | ICD-10-CM | POA: Diagnosis not present

## 2021-07-15 DIAGNOSIS — Z471 Aftercare following joint replacement surgery: Secondary | ICD-10-CM | POA: Diagnosis not present

## 2021-07-15 DIAGNOSIS — Z96641 Presence of right artificial hip joint: Secondary | ICD-10-CM | POA: Diagnosis not present

## 2021-07-15 HISTORY — DX: Osteoarthritis of hip, unspecified: M16.9

## 2021-07-15 HISTORY — PX: TOTAL HIP ARTHROPLASTY: SHX124

## 2021-07-15 HISTORY — DX: Failed or difficult intubation, initial encounter: T88.4XXA

## 2021-07-15 HISTORY — DX: Unilateral primary osteoarthritis, left hip: M16.12

## 2021-07-15 LAB — CBC
HCT: 44.9 % (ref 39.0–52.0)
Hemoglobin: 15.3 g/dL (ref 13.0–17.0)
MCH: 32.2 pg (ref 26.0–34.0)
MCHC: 34.1 g/dL (ref 30.0–36.0)
MCV: 94.5 fL (ref 80.0–100.0)
Platelets: 412 10*3/uL — ABNORMAL HIGH (ref 150–400)
RBC: 4.75 MIL/uL (ref 4.22–5.81)
RDW: 14.6 % (ref 11.5–15.5)
WBC: 7 10*3/uL (ref 4.0–10.5)
nRBC: 0 % (ref 0.0–0.2)

## 2021-07-15 LAB — COMPREHENSIVE METABOLIC PANEL
ALT: 28 U/L (ref 0–44)
AST: 27 U/L (ref 15–41)
Albumin: 4.3 g/dL (ref 3.5–5.0)
Alkaline Phosphatase: 65 U/L (ref 38–126)
Anion gap: 10 (ref 5–15)
BUN: 18 mg/dL (ref 8–23)
CO2: 23 mmol/L (ref 22–32)
Calcium: 9.4 mg/dL (ref 8.9–10.3)
Chloride: 106 mmol/L (ref 98–111)
Creatinine, Ser: 1.13 mg/dL (ref 0.61–1.24)
GFR, Estimated: >60 mL/min (ref >60)
Glucose, Bld: 131 mg/dL — ABNORMAL HIGH (ref 70–99)
Potassium: 4.1 mmol/L (ref 3.5–5.1)
Sodium: 139 mmol/L (ref 135–145)
Total Bilirubin: 0.8 mg/dL (ref 0.3–1.2)
Total Protein: 7.7 g/dL (ref 6.5–8.1)

## 2021-07-15 LAB — ABO/RH: ABO/RH(D): A POS

## 2021-07-15 SURGERY — ARTHROPLASTY, HIP, TOTAL, ANTERIOR APPROACH
Anesthesia: General | Site: Hip | Laterality: Left

## 2021-07-15 MED ORDER — ESMOLOL HCL 100 MG/10ML IV SOLN
INTRAVENOUS | Status: DC | PRN
  Administered 2021-07-15: 30 mg via INTRAVENOUS

## 2021-07-15 MED ORDER — LOSARTAN POTASSIUM-HCTZ 100-25 MG PO TABS: 1.0000 | ORAL_TABLET | Freq: Every morning | ORAL | Status: DC

## 2021-07-15 MED ORDER — ONDANSETRON HCL 4 MG/2ML IJ SOLN
4.0000 mg | Freq: Four times a day (QID) | INTRAMUSCULAR | Status: DC | PRN
  Administered 2021-07-15: 4 mg via INTRAVENOUS
  Filled 2021-07-15: qty 2

## 2021-07-15 MED ORDER — METOCLOPRAMIDE HCL 5 MG/ML IJ SOLN: 5.0000 mg | Freq: Three times a day (TID) | INTRAMUSCULAR | Status: DC | PRN

## 2021-07-15 MED ORDER — TRANEXAMIC ACID-NACL 1000-0.7 MG/100ML-% IV SOLN
1000.0000 mg | INTRAVENOUS | Status: AC
  Administered 2021-07-15: 1000 mg via INTRAVENOUS
  Filled 2021-07-15: qty 100

## 2021-07-15 MED ORDER — PROPOFOL 10 MG/ML IV BOLUS
INTRAVENOUS | Status: DC | PRN
  Administered 2021-07-15: 150 mg via INTRAVENOUS
  Administered 2021-07-15 (×2): 50 mg via INTRAVENOUS

## 2021-07-15 MED ORDER — HYDROMORPHONE HCL 1 MG/ML IJ SOLN
INTRAMUSCULAR | Status: AC
  Filled 2021-07-15: qty 1

## 2021-07-15 MED ORDER — SUGAMMADEX SODIUM 500 MG/5ML IV SOLN
INTRAVENOUS | Status: DC | PRN
  Administered 2021-07-15: 250 mg via INTRAVENOUS

## 2021-07-15 MED ORDER — CHLORHEXIDINE GLUCONATE 0.12 % MT SOLN
15.0000 mL | Freq: Once | OROMUCOSAL | Status: AC
  Administered 2021-07-15: 15 mL via OROMUCOSAL

## 2021-07-15 MED ORDER — LIDOCAINE HCL (PF) 2 % IJ SOLN
INTRAMUSCULAR | Status: AC
  Filled 2021-07-15: qty 5

## 2021-07-15 MED ORDER — FENTANYL CITRATE (PF) 100 MCG/2ML IJ SOLN
INTRAMUSCULAR | Status: AC
  Filled 2021-07-15: qty 2

## 2021-07-15 MED ORDER — ATORVASTATIN CALCIUM 10 MG PO TABS
10.0000 mg | ORAL_TABLET | Freq: Every day | ORAL | Status: DC
  Administered 2021-07-16: 10 mg via ORAL
  Filled 2021-07-15: qty 1

## 2021-07-15 MED ORDER — MORPHINE SULFATE (PF) 2 MG/ML IV SOLN
0.5000 mg | INTRAVENOUS | Status: DC | PRN
  Administered 2021-07-16: 0.5 mg via INTRAVENOUS
  Filled 2021-07-15: qty 1

## 2021-07-15 MED ORDER — WATER FOR IRRIGATION, STERILE IR SOLN
Status: DC | PRN
  Administered 2021-07-15: 2000 mL

## 2021-07-15 MED ORDER — LABETALOL HCL 5 MG/ML IV SOLN
INTRAVENOUS | Status: AC
  Filled 2021-07-15: qty 4

## 2021-07-15 MED ORDER — ACETAMINOPHEN 325 MG PO TABS: 325.0000 mg | ORAL_TABLET | Freq: Four times a day (QID) | ORAL | Status: DC | PRN

## 2021-07-15 MED ORDER — MIDAZOLAM HCL 2 MG/2ML IJ SOLN
INTRAMUSCULAR | Status: DC | PRN
  Administered 2021-07-15: 2 mg via INTRAVENOUS

## 2021-07-15 MED ORDER — LOSARTAN POTASSIUM 50 MG PO TABS
100.0000 mg | ORAL_TABLET | Freq: Every day | ORAL | Status: DC
  Administered 2021-07-16: 100 mg via ORAL
  Filled 2021-07-15: qty 2

## 2021-07-15 MED ORDER — PROPOFOL 1000 MG/100ML IV EMUL
INTRAVENOUS | Status: AC
  Filled 2021-07-15: qty 100

## 2021-07-15 MED ORDER — HYDRALAZINE HCL 20 MG/ML IJ SOLN
INTRAMUSCULAR | Status: AC
  Filled 2021-07-15: qty 1

## 2021-07-15 MED ORDER — ONDANSETRON HCL 4 MG PO TABS: 4.0000 mg | ORAL_TABLET | Freq: Four times a day (QID) | ORAL | Status: DC | PRN

## 2021-07-15 MED ORDER — HYDROMORPHONE HCL 2 MG/ML IJ SOLN
INTRAMUSCULAR | Status: AC
  Filled 2021-07-15: qty 1

## 2021-07-15 MED ORDER — BUPIVACAINE-EPINEPHRINE (PF) 0.25% -1:200000 IJ SOLN
INTRAMUSCULAR | Status: AC
  Filled 2021-07-15: qty 30

## 2021-07-15 MED ORDER — POVIDONE-IODINE 10 % EX SWAB: 2.0000 "application " | Freq: Once | CUTANEOUS | Status: DC

## 2021-07-15 MED ORDER — NITROGLYCERIN 0.4 MG SL SUBL
0.4000 mg | SUBLINGUAL_TABLET | SUBLINGUAL | Status: DC | PRN
Start: 2021-07-15 — End: 2021-07-16

## 2021-07-15 MED ORDER — SODIUM CHLORIDE 0.9 % IV SOLN
6.2500 mg | Freq: Once | INTRAVENOUS | Status: AC
Start: 2021-07-15 — End: 2021-07-16
  Administered 2021-07-15: 6.25 mg via INTRAVENOUS
  Filled 2021-07-15 (×4): qty 0.25

## 2021-07-15 MED ORDER — DEXAMETHASONE SODIUM PHOSPHATE 10 MG/ML IJ SOLN: 8.0000 mg | Freq: Once | INTRAMUSCULAR | Status: DC

## 2021-07-15 MED ORDER — GABAPENTIN 300 MG PO CAPS
600.0000 mg | ORAL_CAPSULE | Freq: Two times a day (BID) | ORAL | Status: DC
  Administered 2021-07-15 – 2021-07-16 (×2): 600 mg via ORAL
  Filled 2021-07-15 (×2): qty 2

## 2021-07-15 MED ORDER — LABETALOL HCL 5 MG/ML IV SOLN
5.0000 mg | Freq: Once | INTRAVENOUS | Status: AC
  Administered 2021-07-15: 5 mg via INTRAVENOUS

## 2021-07-15 MED ORDER — DEXAMETHASONE SODIUM PHOSPHATE 10 MG/ML IJ SOLN
10.0000 mg | Freq: Once | INTRAMUSCULAR | Status: AC
  Administered 2021-07-16: 10 mg via INTRAVENOUS
  Filled 2021-07-15: qty 1

## 2021-07-15 MED ORDER — MUPIROCIN 2 % EX OINT
1.0000 "application " | TOPICAL_OINTMENT | Freq: Once | CUTANEOUS | Status: AC
  Administered 2021-07-15: 1 via TOPICAL
  Filled 2021-07-15: qty 22

## 2021-07-15 MED ORDER — PROPOFOL 10 MG/ML IV BOLUS
INTRAVENOUS | Status: AC
  Filled 2021-07-15: qty 20

## 2021-07-15 MED ORDER — PANTOPRAZOLE SODIUM 40 MG PO TBEC
80.0000 mg | DELAYED_RELEASE_TABLET | Freq: Every day | ORAL | Status: DC
  Administered 2021-07-16: 80 mg via ORAL
  Filled 2021-07-15: qty 2

## 2021-07-15 MED ORDER — FENTANYL CITRATE (PF) 100 MCG/2ML IJ SOLN
INTRAMUSCULAR | Status: DC | PRN
Start: 2021-07-15 — End: 2021-07-15
  Administered 2021-07-15: 50 ug via INTRAVENOUS
  Administered 2021-07-15: 100 ug via INTRAVENOUS
  Administered 2021-07-15 (×4): 50 ug via INTRAVENOUS

## 2021-07-15 MED ORDER — BUPIVACAINE-EPINEPHRINE (PF) 0.25% -1:200000 IJ SOLN
INTRAMUSCULAR | Status: DC | PRN
  Administered 2021-07-15: 30 mL

## 2021-07-15 MED ORDER — DEXAMETHASONE SODIUM PHOSPHATE 10 MG/ML IJ SOLN
INTRAMUSCULAR | Status: AC
  Filled 2021-07-15: qty 1

## 2021-07-15 MED ORDER — FLUTICASONE FUROATE-VILANTEROL 200-25 MCG/ACT IN AEPB
1.0000 | INHALATION_SPRAY | Freq: Every day | RESPIRATORY_TRACT | Status: DC
  Administered 2021-07-16: 1 via RESPIRATORY_TRACT
  Filled 2021-07-15: qty 28

## 2021-07-15 MED ORDER — CEFAZOLIN SODIUM-DEXTROSE 2-4 GM/100ML-% IV SOLN
2.0000 g | Freq: Four times a day (QID) | INTRAVENOUS | Status: AC
  Administered 2021-07-15 – 2021-07-16 (×2): 2 g via INTRAVENOUS
  Filled 2021-07-15 (×2): qty 100

## 2021-07-15 MED ORDER — DOCUSATE SODIUM 100 MG PO CAPS
100.0000 mg | ORAL_CAPSULE | Freq: Two times a day (BID) | ORAL | Status: DC
  Administered 2021-07-15 – 2021-07-16 (×2): 100 mg via ORAL
  Filled 2021-07-15 (×2): qty 1

## 2021-07-15 MED ORDER — FENTANYL CITRATE (PF) 250 MCG/5ML IJ SOLN
INTRAMUSCULAR | Status: AC
  Filled 2021-07-15: qty 5

## 2021-07-15 MED ORDER — MENTHOL 3 MG MT LOZG: 1.0000 | LOZENGE | OROMUCOSAL | Status: DC | PRN

## 2021-07-15 MED ORDER — 0.9 % SODIUM CHLORIDE (POUR BTL) OPTIME
TOPICAL | Status: DC | PRN
  Administered 2021-07-15: 1000 mL

## 2021-07-15 MED ORDER — METHOCARBAMOL 500 MG PO TABS
500.0000 mg | ORAL_TABLET | Freq: Four times a day (QID) | ORAL | Status: DC | PRN
  Administered 2021-07-16: 500 mg via ORAL
  Filled 2021-07-15: qty 1

## 2021-07-15 MED ORDER — LACTATED RINGERS IV SOLN: INTRAVENOUS | Status: DC

## 2021-07-15 MED ORDER — ORAL CARE MOUTH RINSE: 15.0000 mL | Freq: Once | OROMUCOSAL | Status: AC

## 2021-07-15 MED ORDER — DEXAMETHASONE SODIUM PHOSPHATE 10 MG/ML IJ SOLN
INTRAMUSCULAR | Status: DC | PRN
  Administered 2021-07-15: 10 mg via INTRAVENOUS

## 2021-07-15 MED ORDER — TRAMADOL HCL 50 MG PO TABS: 50.0000 mg | ORAL_TABLET | Freq: Four times a day (QID) | ORAL | Status: DC | PRN

## 2021-07-15 MED ORDER — METHOCARBAMOL 500 MG IVPB - SIMPLE MED: 500.0000 mg | Freq: Four times a day (QID) | INTRAVENOUS | Status: DC | PRN

## 2021-07-15 MED ORDER — ACETAMINOPHEN 10 MG/ML IV SOLN
1000.0000 mg | Freq: Four times a day (QID) | INTRAVENOUS | Status: DC
  Administered 2021-07-15: 1000 mg via INTRAVENOUS
  Filled 2021-07-15: qty 100

## 2021-07-15 MED ORDER — ONDANSETRON HCL 4 MG/2ML IJ SOLN
INTRAMUSCULAR | Status: AC
  Filled 2021-07-15: qty 2

## 2021-07-15 MED ORDER — BISACODYL 10 MG RE SUPP: 10.0000 mg | Freq: Every day | RECTAL | Status: DC | PRN

## 2021-07-15 MED ORDER — HYDROMORPHONE HCL 1 MG/ML IJ SOLN
INTRAMUSCULAR | Status: DC | PRN
  Administered 2021-07-15: 1 mg via INTRAVENOUS
  Administered 2021-07-15 (×2): .5 mg via INTRAVENOUS

## 2021-07-15 MED ORDER — FLEET ENEMA 7-19 GM/118ML RE ENEM: 1.0000 | ENEMA | Freq: Once | RECTAL | Status: DC | PRN

## 2021-07-15 MED ORDER — ALBUTEROL SULFATE (2.5 MG/3ML) 0.083% IN NEBU: 2.5000 mL | INHALATION_SOLUTION | Freq: Four times a day (QID) | RESPIRATORY_TRACT | Status: DC | PRN

## 2021-07-15 MED ORDER — HYDROMORPHONE HCL 1 MG/ML IJ SOLN
0.2500 mg | INTRAMUSCULAR | Status: DC | PRN
  Administered 2021-07-15 (×2): 0.5 mg via INTRAVENOUS

## 2021-07-15 MED ORDER — PHENOL 1.4 % MT LIQD: 1.0000 | OROMUCOSAL | Status: DC | PRN

## 2021-07-15 MED ORDER — HYDROCHLOROTHIAZIDE 25 MG PO TABS
25.0000 mg | ORAL_TABLET | Freq: Every day | ORAL | Status: DC
  Administered 2021-07-16: 25 mg via ORAL
  Filled 2021-07-15: qty 1

## 2021-07-15 MED ORDER — SODIUM CHLORIDE (PF) 0.9 % IJ SOLN
INTRAMUSCULAR | Status: AC
  Filled 2021-07-15: qty 10

## 2021-07-15 MED ORDER — DIPHENHYDRAMINE HCL 12.5 MG/5ML PO ELIX: 12.5000 mg | ORAL_SOLUTION | ORAL | Status: DC | PRN

## 2021-07-15 MED ORDER — LIDOCAINE 2% (20 MG/ML) 5 ML SYRINGE
INTRAMUSCULAR | Status: DC | PRN
  Administered 2021-07-15: 100 mg via INTRAVENOUS

## 2021-07-15 MED ORDER — ONDANSETRON HCL 4 MG/2ML IJ SOLN
INTRAMUSCULAR | Status: DC | PRN
  Administered 2021-07-15: 4 mg via INTRAVENOUS

## 2021-07-15 MED ORDER — SUGAMMADEX SODIUM 500 MG/5ML IV SOLN
INTRAVENOUS | Status: AC
  Filled 2021-07-15: qty 5

## 2021-07-15 MED ORDER — ROCURONIUM BROMIDE 10 MG/ML (PF) SYRINGE
PREFILLED_SYRINGE | INTRAVENOUS | Status: DC | PRN
  Administered 2021-07-15: 50 mg via INTRAVENOUS
  Administered 2021-07-15: 20 mg via INTRAVENOUS

## 2021-07-15 MED ORDER — MIDAZOLAM HCL 2 MG/2ML IJ SOLN
INTRAMUSCULAR | Status: AC
Start: 2021-07-15 — End: ?
  Filled 2021-07-15: qty 2

## 2021-07-15 MED ORDER — SODIUM CHLORIDE 0.9 % IV SOLN: INTRAVENOUS | Status: DC

## 2021-07-15 MED ORDER — METOCLOPRAMIDE HCL 5 MG PO TABS: 5.0000 mg | ORAL_TABLET | Freq: Three times a day (TID) | ORAL | Status: DC | PRN

## 2021-07-15 MED ORDER — HYDRALAZINE HCL 20 MG/ML IJ SOLN
INTRAMUSCULAR | Status: DC | PRN
  Administered 2021-07-15 (×4): 2.5 mg via INTRAVENOUS

## 2021-07-15 MED ORDER — ROCURONIUM BROMIDE 10 MG/ML (PF) SYRINGE
PREFILLED_SYRINGE | INTRAVENOUS | Status: AC
  Filled 2021-07-15: qty 10

## 2021-07-15 MED ORDER — HYDROCODONE-ACETAMINOPHEN 5-325 MG PO TABS
1.0000 | ORAL_TABLET | ORAL | Status: DC | PRN
  Administered 2021-07-16 (×4): 2 via ORAL
  Filled 2021-07-15 (×4): qty 2

## 2021-07-15 MED ORDER — POLYETHYLENE GLYCOL 3350 17 G PO PACK: 17.0000 g | PACK | Freq: Every day | ORAL | Status: DC | PRN

## 2021-07-15 MED ORDER — RIVAROXABAN 10 MG PO TABS
10.0000 mg | ORAL_TABLET | Freq: Every day | ORAL | Status: DC
  Administered 2021-07-16: 10 mg via ORAL
  Filled 2021-07-15: qty 1

## 2021-07-15 MED ORDER — CEFAZOLIN SODIUM-DEXTROSE 2-4 GM/100ML-% IV SOLN
2.0000 g | INTRAVENOUS | Status: AC
  Administered 2021-07-15: 2 g via INTRAVENOUS
  Filled 2021-07-15: qty 100

## 2021-07-15 MED ORDER — ESMOLOL HCL 100 MG/10ML IV SOLN
INTRAVENOUS | Status: AC
  Filled 2021-07-15: qty 10

## 2021-07-15 MED ORDER — SODIUM CHLORIDE 0.9 % IV SOLN: 6.2500 mg | Freq: Once | INTRAVENOUS | Status: DC

## 2021-07-15 SURGICAL SUPPLY — 45 items
BAG COUNTER SPONGE SURGICOUNT (BAG) ×1 IMPLANT
BAG DECANTER FOR FLEXI CONT (MISCELLANEOUS) IMPLANT
BAG ZIPLOCK 12X15 (MISCELLANEOUS) IMPLANT
BLADE SAG 18X100X1.27 (BLADE) ×2 IMPLANT
CLSR STERI-STRIP ANTIMIC 1/2X4 (GAUZE/BANDAGES/DRESSINGS) ×1 IMPLANT
COVER PERINEAL POST (MISCELLANEOUS) ×2 IMPLANT
COVER SURGICAL LIGHT HANDLE (MISCELLANEOUS) ×2 IMPLANT
CUP ACETBLR 54 OD PINNACLE (Hips) ×1 IMPLANT
DRAPE FOOT SWITCH (DRAPES) ×2 IMPLANT
DRAPE STERI IOBAN 125X83 (DRAPES) ×2 IMPLANT
DRAPE U-SHAPE 47X51 STRL (DRAPES) ×4 IMPLANT
DRSG AQUACEL AG ADV 3.5X10 (GAUZE/BANDAGES/DRESSINGS) ×2 IMPLANT
DURAPREP 26ML APPLICATOR (WOUND CARE) ×2 IMPLANT
ELECT REM PT RETURN 15FT ADLT (MISCELLANEOUS) ×2 IMPLANT
GLOVE BIO SURGEON STRL SZ 6.5 (GLOVE) ×2 IMPLANT
GLOVE BIO SURGEON STRL SZ7.5 (GLOVE) IMPLANT
GLOVE BIO SURGEON STRL SZ8 (GLOVE) ×2 IMPLANT
GLOVE BIOGEL PI IND STRL 6.5 (GLOVE) IMPLANT
GLOVE BIOGEL PI IND STRL 7.0 (GLOVE) IMPLANT
GLOVE BIOGEL PI IND STRL 8 (GLOVE) ×1 IMPLANT
GLOVE BIOGEL PI INDICATOR 6.5 (GLOVE) ×1
GLOVE BIOGEL PI INDICATOR 7.0 (GLOVE) ×2
GLOVE BIOGEL PI INDICATOR 8 (GLOVE) ×1
GOWN STRL REUS W/ TWL LRG LVL3 (GOWN DISPOSABLE) ×1 IMPLANT
GOWN STRL REUS W/ TWL XL LVL3 (GOWN DISPOSABLE) IMPLANT
GOWN STRL REUS W/TWL LRG LVL3 (GOWN DISPOSABLE) ×2
GOWN STRL REUS W/TWL XL LVL3 (GOWN DISPOSABLE) ×2
HEAD CERAMIC DELTA 36 PLUS 1.5 (Hips) ×1 IMPLANT
HOLDER FOLEY CATH W/STRAP (MISCELLANEOUS) ×2 IMPLANT
KIT TURNOVER KIT A (KITS) ×1 IMPLANT
LINER MARATHON NEUT +4X54X36 (Hips) ×1 IMPLANT
MANIFOLD NEPTUNE II (INSTRUMENTS) ×2 IMPLANT
PACK ANTERIOR HIP CUSTOM (KITS) ×2 IMPLANT
PENCIL SMOKE EVACUATOR COATED (MISCELLANEOUS) ×2 IMPLANT
SPIKE FLUID TRANSFER (MISCELLANEOUS) ×2 IMPLANT
STEM FEMORAL SZ6 HIGH ACTIS (Stem) ×1 IMPLANT
STRIP CLOSURE SKIN 1/2X4 (GAUZE/BANDAGES/DRESSINGS) ×2 IMPLANT
SUT ETHIBOND NAB CT1 #1 30IN (SUTURE) ×2 IMPLANT
SUT MNCRL AB 4-0 PS2 18 (SUTURE) ×2 IMPLANT
SUT STRATAFIX 0 PDS 27 VIOLET (SUTURE) ×2
SUT VIC AB 2-0 CT1 27 (SUTURE) ×4
SUT VIC AB 2-0 CT1 TAPERPNT 27 (SUTURE) ×2 IMPLANT
SUTURE STRATFX 0 PDS 27 VIOLET (SUTURE) ×1 IMPLANT
TRAY FOLEY MTR SLVR 16FR STAT (SET/KITS/TRAYS/PACK) ×2 IMPLANT
TUBE SUCTION HIGH CAP CLEAR NV (SUCTIONS) ×2 IMPLANT

## 2021-07-15 NOTE — TOC Initial Note (Signed)
Transition of Care [facility]) - Initial/Assessment Note    Patient Details  Name: Jose Clay MRN: [id number] Date of Birth: 04-03-1955  [facility] CM/SW Contact:    Dessa Phi, RN Phone Number: 07/15/2021, 3:01 PM  Clinical Narrative: s/p L THA . Await PT recc.                  Expected Discharge Plan:  (TBD) Barriers to Discharge: Continued Medical Work up   Patient Goals and CMS Choice Patient states their goals for this hospitalization and ongoing recovery are:: Home CMS Medicare.gov Compare Post Acute Care list provided to:: Patient Choice offered to / list presented to : Patient  Expected Discharge Plan and Services Expected Discharge Plan:  (TBD)   Discharge Planning Services: CM Consult   Living arrangements for the past 2 months: Single Family Home                                      Prior Living Arrangements/Services Living arrangements for the past 2 months: Single Family Home Lives with:: Spouse Patient language and need for interpreter reviewed:: Yes Do you feel safe going back to the place where you live?: Yes      Need for Family Participation in Patient Care: Yes (Comment) Care giver support system in place?: Yes (comment)   Criminal Activity/Legal Involvement Pertinent to Current Situation/Hospitalization: No - Comment as needed  Activities of Daily Living      Permission Sought/Granted Permission sought to share information with : Case Manager Permission granted to share information with : Yes, Verbal Permission Granted  Share Information with NAME: Case Manager           Emotional Assessment Appearance:: Appears stated age Attitude/Demeanor/Rapport: Gracious Affect (typically observed): Accepting Orientation: : Oriented to Self, Oriented to Place, Oriented to  Time, Oriented to Situation Alcohol / Substance Use: Not Applicable Psych Involvement: No (comment)  Admission diagnosis:  Osteoarthritis of left hip  [M16.12] Patient Active Problem List   Diagnosis Date Noted   OA (osteoarthritis) of hip 07/15/2021   Osteoarthritis of left hip 07/15/2021   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   Pain, abdominal, LUQ 09/16/2020   Mixed dyslipidemia 04/10/2020   Asthma    GERD (gastroesophageal reflux disease)    Hypertension    OSA on CPAP    Anal fissure 12/04/2019   Anal pain 12/04/2019   Screening examination for other arthropod-borne viral diseases 12/04/2019   CAD (coronary artery disease) 11/01/2018   OSA (obstructive sleep apnea)    Essential hypertension    Abnormal nuclear stress test 09/22/2017   Abnormal nuclear cardiac imaging test 08/13/2016   Preoperative cardiovascular examination 08/12/2016   RUQ abdominal pain 08/22/2015   Pancreatitis, acute 08/22/2015   Myocardial infarction ([place]) 2017   History of blood transfusion 1988   PCP:  Cyndi Bender, PA-C Pharmacy:   [id number] [id number]: [phone]: [phone]    Social Determinants of Health (SDOH) Interventions    Readmission Risk Interventions     View : No data to display.

## 2021-07-15 NOTE — Progress Notes (Signed)
Pt placed on cpap auto setting with 3L o2 bled in.

## 2021-07-15 NOTE — Interval H&P Note (Signed)
History and Physical Interval Note:  07/15/2021 9:49 AM  Jose Clay  has presented today for surgery, with the diagnosis of Left hip osteoarthritis.  The various methods of treatment have been discussed with the patient and family. After consideration of risks, benefits and other options for treatment, the patient has consented to  Procedure(s): TOTAL HIP ARTHROPLASTY ANTERIOR APPROACH (Left) as a surgical intervention.  The patient's history has been reviewed, patient examined, no change in status, stable for surgery.  I have reviewed the patient's chart and labs.  Questions were answered to the patient's satisfaction.     Pilar Plate Shamiracle Gorden

## 2021-07-15 NOTE — Anesthesia Postprocedure Evaluation (Signed)
Anesthesia Post Note  Patient: Jose Clay  Procedure(s) Performed: TOTAL HIP ARTHROPLASTY ANTERIOR APPROACH (Left: Hip)     Patient location during evaluation: PACU Anesthesia Type: General Level of consciousness: awake Pain management: pain level controlled Vital Signs Assessment: post-procedure vital signs reviewed and stable Respiratory status: spontaneous breathing Cardiovascular status: stable Postop Assessment: no apparent nausea or vomiting Anesthetic complications: no   No notable events documented.  Last Vitals:  Vitals:   07/15/21 1643 07/15/21 1851  BP: 140/82 135/90  Pulse: 85 79  Resp:    Temp: 36.6 C 36.6 C  SpO2: 95% 91%    Last Pain:  Vitals:   07/15/21 1851  TempSrc: Oral  PainSc:                  Otoniel Myhand

## 2021-07-15 NOTE — Progress Notes (Signed)
PT Cancellation Note  Patient Details Name: [name] MRN: [id number] DOB: 10-23-1955   Cancelled Treatment:    Reason Eval/Treat Not Completed: Fatigue/lethargy limiting ability to participate: upon checking on pt, pt asleep with CPAP on, per RN recently nauseous and received zofran. Will check back as schedule and pt status allows which may be another day.   Coolidge Breeze, PT, DPT [facility] Department Office: [phone] Pager: [phone]  Lexton Hidalgo 07/15/2021, 5:47 PM

## 2021-07-15 NOTE — Op Note (Signed)
OPERATIVE REPORT- TOTAL HIP ARTHROPLASTY   PREOPERATIVE DIAGNOSIS: Osteoarthritis of the Left hip.   POSTOPERATIVE DIAGNOSIS: Osteoarthritis of the Left  hip.   PROCEDURE: Left total hip arthroplasty, anterior approach.   SURGEON: Gaynelle Arabian, MD   ASSISTANT: Theresa Duty, PA-C  ANESTHESIA:  General  ESTIMATED BLOOD LOSS:-750 mL    DRAINS: None  COMPLICATIONS: None   CONDITION: PACU - hemodynamically stable.   BRIEF CLINICAL NOTE: Jose Clay is a 66 y.o. male who has advanced end-  stage arthritis of their Left  hip with progressively worsening pain and  dysfunction.The patient has failed nonoperative management and presents for  total hip arthroplasty.   PROCEDURE IN DETAIL: After successful administration of spinal  anesthetic, the traction boots for the [facility] bed were placed on both  feet and the patient was placed onto the [facility] bed, boots placed into the leg  holders. The Left hip was then isolated from the perineum with plastic  drapes and prepped and draped in the usual sterile fashion. ASIS and  greater trochanter were marked and a oblique incision was made, starting  at about 1 cm lateral and 2 cm distal to the ASIS and coursing towards  the anterior cortex of the femur. The skin was cut with a 10 blade  through subcutaneous tissue to the level of the fascia overlying the  tensor fascia lata muscle. The fascia was then incised in line with the  incision at the junction of the anterior third and posterior 2/3rd. The  muscle was teased off the fascia and then the interval between the TFL  and the rectus was developed. The Hohmann retractor was then placed at  the top of the femoral neck over the capsule. The vessels overlying the  capsule were cauterized and the fat on top of the capsule was removed.  A Hohmann retractor was then placed anterior underneath the rectus  femoris to give exposure to the entire anterior capsule. A T-shaped  capsulotomy  was performed. The edges were tagged and the femoral head  was identified.       Osteophytes are removed off the superior acetabulum.  The femoral neck was then cut in situ with an oscillating saw. Traction  was then applied to the left lower extremity utilizing the [facility]  traction. The femoral head was then removed. Retractors were placed  around the acetabulum and then circumferential removal of the labrum was  performed. Osteophytes were also removed. Reaming starts at 51 mm to  medialize and  Increased in 2 mm increments to 53 mm. We reamed in  approximately 40 degrees of abduction, 20 degrees anteversion. A 53 mm  pinnacle acetabular shell was then impacted in anatomic position under  fluoroscopic guidance with excellent purchase. We did not need to place  any additional dome screws. A 36 mm neutral + 4 marathon liner was then  placed into the acetabular shell.       The femoral lift was then placed along the lateral aspect of the femur  just distal to the vastus ridge. The leg was  externally rotated and capsule  was stripped off the inferior aspect of the femoral neck down to the  level of the lesser trochanter, this was done with electrocautery. The femur was lifted after this was performed. The  leg was then placed in an extended and adducted position essentially delivering the femur. We also removed the capsule superiorly and the piriformis from the piriformis fossa to  gain excellent exposure of the  proximal femur. Rongeur was used to remove some cancellous bone to get  into the lateral portion of the proximal femur for placement of the  initial starter reamer. The starter broaches was placed  the starter broach  and was shown to go down the center of the canal. Broaching  with the Actis system was then performed starting at size 0  coursing  Up to size 6. A size 6 had excellent torsional and rotational  and axial stability. The trial high offset neck was then placed  with a 36 +  1.5 trial head. The hip was then reduced. We confirmed that  the stem was in the canal both on AP and lateral x-rays. It also has excellent sizing. The hip was reduced with outstanding stability through full extension and full external rotation.. AP pelvis was taken and the leg lengths were measured and found to be equal. Hip was then dislocated again and the femoral head and neck removed. The  femoral broach was removed. Size 6 Actis stem with a high offset  neck was then impacted into the femur following native anteversion. Has  excellent purchase in the canal. Excellent torsional and rotational and  axial stability. It is confirmed to be in the canal on AP and lateral  fluoroscopic views. The 36 + 1.5 ceramic  head was placed and the hip  reduced with outstanding stability. Again AP pelvis was taken and it  confirmed that the leg lengths were equal. The wound was then copiously  irrigated with saline solution and the capsule reattached and repaired  with Ethibond suture. 30 ml of .25% Bupivicaine was  injected into the capsule and into the edge of the tensor fascia lata as well as subcutaneous tissue. The fascia overlying the tensor fascia lata was then closed with a running #1 V-Loc. Subcu was closed with interrupted 2-0 Vicryl and subcuticular running 4-0 Monocryl. Incision was cleaned  and dried. Steri-Strips and a bulky sterile dressing applied. The patient was awakened and transported to  recovery in stable condition.        Please note that a surgical assistant was a medical necessity for this procedure to perform it in a safe and expeditious manner. Assistant was necessary to provide appropriate retraction of vital neurovascular structures and to prevent femoral fracture and allow for anatomic placement of the prosthesis.  Gaynelle Arabian, M.D.

## 2021-07-15 NOTE — Anesthesia Procedure Notes (Signed)
Procedure Name: Intubation Date/Time: 07/15/2021 11:13 AM Performed by: Renato Shin, CRNA Pre-anesthesia Checklist: Patient identified, Emergency Drugs available, Suction available and Patient being monitored Patient Re-evaluated:Patient Re-evaluated prior to induction Oxygen Delivery Method: Circle system utilized Preoxygenation: Pre-oxygenation with 100% oxygen Induction Type: IV induction Ventilation: Mask ventilation without difficulty Laryngoscope Size: Glidescope and 4 Grade View: Grade II Tube type: Oral Tube size: 7.5 mm Number of attempts: 1 Airway Equipment and Method: Rigid stylet and Video-laryngoscopy Placement Confirmation: positive ETCO2, breath sounds checked- equal and bilateral and ETT inserted through vocal cords under direct vision Secured at: 22 cm Tube secured with: Tape Dental Injury: Teeth and Oropharynx as per pre-operative assessment  Difficulty Due To: Difficulty was anticipated, Difficult Airway- due to large tongue and Difficult Airway- due to reduced neck mobility Future Recommendations: Recommend- induction with short-acting agent, and alternative techniques readily available Comments: DL x1 Mil3, Grade 4 view. Grade 2 view with glidescope. EZ mask with OA

## 2021-07-15 NOTE — Discharge Instructions (Addendum)
Frank Aluisio, MD Total Joint Specialist [facility] [place] [address] [place], [place] [address] [phone]  ANTERIOR APPROACH TOTAL HIP REPLACEMENT POSTOPERATIVE DIRECTIONS     Hip Rehabilitation, Guidelines Following Surgery  The results of a hip operation are greatly improved after range of motion and muscle strengthening exercises. Follow all safety measures which are given to protect your hip. If any of these exercises cause increased pain or swelling in your joint, decrease the amount until you are comfortable again. Then slowly increase the exercises. Call your caregiver if you have problems or questions.   BLOOD CLOT PREVENTION Take a 10 mg Xarelto once a day for three weeks following surgery. Then take an 81 mg Aspirin once a day for three weeks. Then discontinue Aspirin. You may resume your vitamins/supplements once you have discontinued the Xarelto. Do not take any NSAIDs (Advil, Aleve, Ibuprofen, Meloxicam, etc.) until you have discontinued the Xarelto.    HOME CARE INSTRUCTIONS  Remove items at home which could result in a fall. This includes throw rugs or furniture in walking pathways.  ICE to the affected hip as frequently as 20-30 minutes an hour and then as needed for pain and swelling. Continue to use ice on the hip for pain and swelling from surgery. You may notice swelling that will progress down to the foot and ankle. This is normal after surgery. Elevate the leg when you are not up walking on it.   Continue to use the breathing machine which will help keep your temperature down.  It is common for your temperature to cycle up and down following surgery, especially at night when you are not up moving around and exerting yourself.  The breathing machine keeps your lungs expanded and your temperature down.  DIET You may resume your previous home diet once your are discharged from the hospital.  DRESSING / WOUND CARE / SHOWERING You have an  adhesive waterproof bandage over the incision. Leave this in place until your first follow-up appointment. Once you remove this you will not need to place another bandage.  You may begin showering 3 days following surgery, but do not submerge the incision under water.  ACTIVITY For the first 3-5 days, it is important to rest and keep the operative leg elevated. You should, as a general rule, rest for 50 minutes and walk/stretch for 10 minutes per hour. After 5 days, you may slowly increase activity as tolerated.  Perform the exercises you were provided twice a day for about 15-20 minutes each session. Begin these 2 days following surgery. Walk with your walker as instructed. Use the walker until you are comfortable transitioning to a cane. Walk with the cane in the opposite hand of the operative leg. You may discontinue the cane once you are comfortable and walking steadily. Avoid periods of inactivity such as sitting longer than an hour when not asleep. This helps prevent blood clots.  Do not drive a car for 6 weeks or until released by your surgeon.  Do not drive while taking narcotics.  TED HOSE STOCKINGS Wear the elastic stockings on both legs for three weeks following surgery during the day. You may remove them at night while sleeping.  WEIGHT BEARING Weight bearing as tolerated with assist device (walker, cane, etc) as directed, use it as long as suggested by your surgeon or therapist, typically at least 4-6 weeks.  POSTOPERATIVE CONSTIPATION PROTOCOL Constipation - defined medically as fewer than three stools per week and severe constipation as   less than one stool per week.  One of the most common issues patients have following surgery is constipation.  Even if you have a regular bowel pattern at home, your normal regimen is likely to be disrupted due to multiple reasons following surgery.  Combination of anesthesia, postoperative narcotics, change in appetite and fluid intake all can  affect your bowels.  In order to avoid complications following surgery, here are some recommendations in order to help you during your recovery period.  Colace (docusate) - Pick up an over-the-counter form of Colace or another stool softener and take twice a day as long as you are requiring postoperative pain medications.  Take with a full glass of water daily.  If you experience loose stools or diarrhea, hold the colace until you stool forms back up.  If your symptoms do not get better within 1 week or if they get worse, check with your doctor. Dulcolax (bisacodyl) - Pick up over-the-counter and take as directed by the product packaging as needed to assist with the movement of your bowels.  Take with a full glass of water.  Use this product as needed if not relieved by Colace only.  MiraLax (polyethylene glycol) - Pick up over-the-counter to have on hand.  MiraLax is a solution that will increase the amount of water in your bowels to assist with bowel movements.  Take as directed and can mix with a glass of water, juice, soda, coffee, or tea.  Take if you go more than two days without a movement.Do not use MiraLax more than once per day. Call your doctor if you are still constipated or irregular after using this medication for 7 days in a row.  If you continue to have problems with postoperative constipation, please contact the office for further assistance and recommendations.  If you experience "the worst abdominal pain ever" or develop nausea or vomiting, please contact the office immediatly for further recommendations for treatment.  ITCHING  If you experience itching with your medications, try taking only a single pain pill, or even half a pain pill at a time.  You can also use Benadryl over the counter for itching or also to help with sleep.   MEDICATIONS See your medication summary on the "After Visit Summary" that the nursing staff will review with you prior to discharge.  You may have some home  medications which will be placed on hold until you complete the course of blood thinner medication.  It is important for you to complete the blood thinner medication as prescribed by your surgeon.  Continue your approved medications as instructed at time of discharge.  PRECAUTIONS If you experience chest pain or shortness of breath - call 911 immediately for transfer to the hospital emergency department.  If you develop a fever greater that 101 F, purulent drainage from wound, increased redness or drainage from wound, foul odor from the wound/dressing, or calf pain - CONTACT YOUR SURGEON.                                                   FOLLOW-UP APPOINTMENTS Make sure you keep all of your appointments after your operation with your surgeon and caregivers. You should call the office at the above phone number and make an appointment for approximately two weeks after the date of your surgery or on the   date instructed by your surgeon outlined in the "After Visit Summary".  RANGE OF MOTION AND STRENGTHENING EXERCISES  These exercises are designed to help you keep full movement of your hip joint. Follow your caregiver's or physical therapist's instructions. Perform all exercises about fifteen times, three times per day or as directed. Exercise both hips, even if you have had only one joint replacement. These exercises can be done on a training (exercise) mat, on the floor, on a table or on a bed. Use whatever works the best and is most comfortable for you. Use music or television while you are exercising so that the exercises are a pleasant break in your day. This will make your life better with the exercises acting as a break in routine you can look forward to.  Lying on your back, slowly slide your foot toward your buttocks, raising your knee up off the floor. Then slowly slide your foot back down until your leg is straight again.  Lying on your back spread your legs as far apart as you can without causing  discomfort.  Lying on your side, raise your upper leg and foot straight up from the floor as far as is comfortable. Slowly lower the leg and repeat.  Lying on your back, tighten up the muscle in the front of your thigh (quadriceps muscles). You can do this by keeping your leg straight and trying to raise your heel off the floor. This helps strengthen the largest muscle supporting your knee.  Lying on your back, tighten up the muscles of your buttocks both with the legs straight and with the knee bent at a comfortable angle while keeping your heel on the floor.   POST-OPERATIVE OPIOID TAPER INSTRUCTIONS: It is important to wean off of your opioid medication as soon as possible. If you do not need pain medication after your surgery it is ok to stop day one. Opioids include: Codeine, Hydrocodone(Norco, Vicodin), Oxycodone(Percocet, oxycontin) and hydromorphone amongst others.  Long term and even short term use of opiods can cause: Increased pain response Dependence Constipation Depression Respiratory depression And more.  Withdrawal symptoms can include Flu like symptoms Nausea, vomiting And more Techniques to manage these symptoms Hydrate well Eat regular healthy meals Stay active Use relaxation techniques(deep breathing, meditating, yoga) Do Not substitute Alcohol to help with tapering If you have been on opioids for less than two weeks and do not have pain than it is ok to stop all together.  Plan to wean off of opioids This plan should start within one week post op of your joint replacement. Maintain the same interval or time between taking each dose and first decrease the dose.  Cut the total daily intake of opioids by one tablet each day Next start to increase the time between doses. The last dose that should be eliminated is the evening dose.   IF YOU ARE TRANSFERRED TO A SKILLED REHAB FACILITY If the patient is transferred to a skilled rehab facility following release from the  hospital, a list of the current medications will be sent to the facility for the patient to continue.  When discharged from the skilled rehab facility, please have the facility set up the patient's Home Health Physical Therapy prior to being released. Also, the skilled facility will be responsible for providing the patient with their medications at time of release from the facility to include their pain medication, the muscle relaxants, and their blood thinner medication. If the patient is still at the rehab facility   at time of the two week follow up appointment, the skilled rehab facility will also need to assist the patient in arranging follow up appointment in our office and any transportation needs.  MAKE SURE YOU:  Understand these instructions.  Get help right away if you are not doing well or get worse.    DENTAL ANTIBIOTICS:  In most cases prophylactic antibiotics for Dental procdeures after total joint surgery are not necessary.  Exceptions are as follows:  1. History of prior total joint infection  2. Severely immunocompromised (Organ Transplant, cancer chemotherapy, Rheumatoid biologic meds such as Humera)  3. Poorly controlled diabetes (A1C &gt; 8.0, blood glucose over 200)  If you have one of these conditions, contact your surgeon for an antibiotic prescription, prior to your dental procedure.    Pick up stool softner and laxative for home use following surgery while on pain medications. Do not submerge incision under water. Please use good hand washing techniques while changing dressing each day. May shower starting three days after surgery. Please use a clean towel to pat the incision dry following showers. Continue to use ice for pain and swelling after surgery. Do not use any lotions or creams on the incision until instructed by your surgeon.   =============================================================================   Information on my medicine - XARELTO  (Rivaroxaban)  Why was Xarelto prescribed for you? Xarelto was prescribed for you to reduce the risk of blood clots forming after orthopedic surgery. The medical term for these abnormal blood clots is venous thromboembolism (VTE).  What do you need to know about xarelto ? Take your Xarelto ONCE DAILY at the same time every day. You may take it either with or without food.  If you have difficulty swallowing the tablet whole, you may crush it and mix in applesauce just prior to taking your dose.  Take Xarelto exactly as prescribed by your doctor and DO NOT stop taking Xarelto without talking to the doctor who prescribed the medication.  Stopping without other VTE prevention medication to take the place of Xarelto may increase your risk of developing a clot.  After discharge, you should have regular check-up appointments with your healthcare provider that is prescribing your Xarelto.    What do you do if you miss a dose? If you miss a dose, take it as soon as you remember on the same day then continue your regularly scheduled once daily regimen the next day. Do not take two doses of Xarelto on the same day.   Important Safety Information A possible side effect of Xarelto is bleeding. You should call your healthcare provider right away if you experience any of the following: Bleeding from an injury or your nose that does not stop. Unusual colored urine (red or dark brown) or unusual colored stools (red or black). Unusual bruising for unknown reasons. A serious fall or if you hit your head (even if there is no bleeding).  Some medicines may interact with Xarelto and might increase your risk of bleeding while on Xarelto. To help avoid this, consult your healthcare provider or pharmacist prior to using any new prescription or non-prescription medications, including herbals, vitamins, non-steroidal anti-inflammatory drugs (NSAIDs) and supplements.  This website has more information on  Xarelto: www.xarelto.com.   

## 2021-07-15 NOTE — Transfer of Care (Signed)
Immediate Anesthesia Transfer of Care Note  Patient: Jose Clay  Procedure(s) Performed: TOTAL HIP ARTHROPLASTY ANTERIOR APPROACH (Left: Hip)  Patient Location: PACU  Anesthesia Type:General  Level of Consciousness: drowsy  Airway & Oxygen Therapy: Patient Spontanous Breathing and Patient connected to face mask oxygen  Post-op Assessment: Report given to RN and Post -op Vital signs reviewed and stable  Post vital signs: Reviewed and stable  Last Vitals:  Vitals Value Taken Time  BP    Temp    Pulse 103 07/15/21 1316  Resp 17 07/15/21 1316  SpO2 97 % 07/15/21 1316  Vitals shown include unvalidated device data.  Last Pain:  Vitals:   07/15/21 0941  TempSrc:   PainSc: 2       Patients Stated Pain Goal: 4 (96/92/49 3241)  Complications: No notable events documented.

## 2021-07-15 NOTE — Progress Notes (Signed)
Message made to MD on call for [facility] for nausea that is persistent after IV zofran. Awaiting call back. Night shift RN made aware.

## 2021-07-16 ENCOUNTER — Observation Stay (HOSPITAL_COMMUNITY): Payer: Medicare Other

## 2021-07-16 ENCOUNTER — Encounter (HOSPITAL_COMMUNITY): Payer: Self-pay | Admitting: Orthopedic Surgery

## 2021-07-16 DIAGNOSIS — I10 Essential (primary) hypertension: Secondary | ICD-10-CM | POA: Diagnosis not present

## 2021-07-16 DIAGNOSIS — M1612 Unilateral primary osteoarthritis, left hip: Secondary | ICD-10-CM | POA: Diagnosis not present

## 2021-07-16 DIAGNOSIS — T884XXA Failed or difficult intubation, initial encounter: Secondary | ICD-10-CM | POA: Diagnosis not present

## 2021-07-16 DIAGNOSIS — Z96641 Presence of right artificial hip joint: Secondary | ICD-10-CM | POA: Diagnosis not present

## 2021-07-16 DIAGNOSIS — Z7982 Long term (current) use of aspirin: Secondary | ICD-10-CM | POA: Diagnosis not present

## 2021-07-16 DIAGNOSIS — I252 Old myocardial infarction: Secondary | ICD-10-CM | POA: Diagnosis not present

## 2021-07-16 DIAGNOSIS — I2510 Atherosclerotic heart disease of native coronary artery without angina pectoris: Secondary | ICD-10-CM | POA: Diagnosis not present

## 2021-07-16 DIAGNOSIS — Z79899 Other long term (current) drug therapy: Secondary | ICD-10-CM | POA: Diagnosis not present

## 2021-07-16 DIAGNOSIS — R0902 Hypoxemia: Secondary | ICD-10-CM | POA: Diagnosis not present

## 2021-07-16 DIAGNOSIS — J45909 Unspecified asthma, uncomplicated: Secondary | ICD-10-CM | POA: Diagnosis not present

## 2021-07-16 DIAGNOSIS — Z87891 Personal history of nicotine dependence: Secondary | ICD-10-CM | POA: Diagnosis not present

## 2021-07-16 DIAGNOSIS — R269 Unspecified abnormalities of gait and mobility: Secondary | ICD-10-CM | POA: Diagnosis not present

## 2021-07-16 LAB — BASIC METABOLIC PANEL
Anion gap: 7 (ref 5–15)
BUN: 13 mg/dL (ref 8–23)
CO2: 25 mmol/L (ref 22–32)
Calcium: 8.7 mg/dL — ABNORMAL LOW (ref 8.9–10.3)
Chloride: 106 mmol/L (ref 98–111)
Creatinine, Ser: 1.05 mg/dL (ref 0.61–1.24)
GFR, Estimated: >60 mL/min (ref >60)
Glucose, Bld: 144 mg/dL — ABNORMAL HIGH (ref 70–99)
Potassium: 4.2 mmol/L (ref 3.5–5.1)
Sodium: 138 mmol/L (ref 135–145)

## 2021-07-16 LAB — CBC
HCT: 40.8 % (ref 39.0–52.0)
Hemoglobin: 13.8 g/dL (ref 13.0–17.0)
MCH: 32.3 pg (ref 26.0–34.0)
MCHC: 33.8 g/dL (ref 30.0–36.0)
MCV: 95.6 fL (ref 80.0–100.0)
Platelets: 331 10*3/uL (ref 150–400)
RBC: 4.27 MIL/uL (ref 4.22–5.81)
RDW: 15 % (ref 11.5–15.5)
WBC: 17.9 10*3/uL — ABNORMAL HIGH (ref 4.0–10.5)
nRBC: 0 % (ref 0.0–0.2)

## 2021-07-16 MED ORDER — METHOCARBAMOL 500 MG PO TABS: 500.0000 mg | ORAL_TABLET | Freq: Four times a day (QID) | ORAL | 0 refills | Status: DC | PRN

## 2021-07-16 MED ORDER — TRAMADOL HCL 50 MG PO TABS: 50.0000 mg | ORAL_TABLET | Freq: Four times a day (QID) | ORAL | 0 refills | Status: DC | PRN

## 2021-07-16 MED ORDER — RIVAROXABAN 10 MG PO TABS: 10.0000 mg | ORAL_TABLET | Freq: Every day | ORAL | 0 refills | Status: AC

## 2021-07-16 MED ORDER — HYDROCODONE-ACETAMINOPHEN 5-325 MG PO TABS: 1.0000 | ORAL_TABLET | Freq: Four times a day (QID) | ORAL | 0 refills | Status: DC | PRN

## 2021-07-16 NOTE — TOC Transition Note (Signed)
Transition of Care [facility]) - CM/SW Discharge Note   Patient Details  Name: Jose Clay MRN: [id number] Date of Birth: 1955-03-28  Transition of Care [facility]) CM/SW Contact:  Dessa Phi, RN Phone Number: 07/16/2021, 12:37 PM   Clinical Narrative: d/c home. [employer] rep Danielle to deliver rw to rm prior d/c. HEP.Has own transport home.  No further CM needs.    Final next level of care: Home/Self Care Barriers to Discharge: No Barriers Identified   Patient Goals and CMS Choice Patient states their goals for this hospitalization and ongoing recovery are:: Home CMS Medicare.gov Compare Post Acute Care list provided to:: Patient Choice offered to / list presented to : Patient  Discharge Placement                       Discharge Plan and Services   Discharge Planning Services: CM Consult            DME Arranged: Gilford Rile rolling DME Agency: [employer] Date DME Agency Contacted: 07/16/21 Time DME Agency Contacted: 1237 Representative spoke with at DME Agency: Andee Poles            Social Determinants of Health ([place]) Interventions     Readmission Risk Interventions     View : No data to display.
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
## 2021-07-16 NOTE — Progress Notes (Signed)
Physical Therapy Evaluation  Clinical Impression Pt seen for second of two visits POD1.  Pt on 2LO2 via [place] upon entry, SpO2 between 88-92% on 2L while pt at rest and during seated HEP completion. Removed O2 for ambulation in hallway and SpO2 measured between 88-93% with standing rest breaks and pursed lip breathing. Pt min guard for transfer and 1104f ambulation with RW, recliner follow for safety, pt with several standing rest breaks. Completed HEP safely with cuing and use of gait belt for active assistive motion; educated pt and wife on appropriate progression and schedule with special emphasis on walking program. Educated pt and wife on safe stair mobility for home and beach house, verbalized understanding. Patient will benefit from continued skilled PT interventions to address impairments and progress towards PLOF. Acute PT will follow to progress mobility and stair training in preparation for safe discharge home.    07/16/21 1416  PT Visit Information  Last PT Received On 07/16/21  Assistance Needed +1  History of Present Illness Pt is a 89yomale presenting s/p L-THA, AA on 07/15/21, admitted to fourth floor for telemetry. PMH: OA, CAD s/p Cath, asthma, HTN, GERD, Hx of MI 2017, OSA on CPAP, R-THA 2017, R-RCR 2014.  Subjective Data  Subjective I feel sore  Patient Stated Goal [place] the stairs [place] the beach house (two flights)  Precautions  Precautions Fall  Restrictions  Weight Bearing Restrictions Yes  LLE Weight Bearing WBAT  Pain Assessment  Pain Assessment 0-10  Pain Score 5  Pain Location L hip  Pain Descriptors / Indicators Operative site guarding;Pins and needles;Burning  Pain Intervention(s) Limited activity within patient's tolerance;Monitored during session;Repositioned;Ice applied  Cognition  Arousal/Alertness Awake/alert  Behavior During Therapy WFL for tasks assessed/performed  Overall Cognitive Status Within Functional Limits for tasks assessed  Bed Mobility  General  bed mobility comments in recliner at entry and exit  Transfers  Overall transfer level Needs assistance  Equipment used Rolling walker (2 wheels)  Transfers Sit to/from Stand  Sit to Stand Min guard  General transfer comment For safety only, no physical assist required  Ambulation/Gait  Ambulation/Gait assistance Min guard;+2 safety/equipment  Gait Distance (Feet) 150 Feet  Assistive device Rolling walker (2 wheels)  Gait Pattern/deviations Step-to pattern;Decreased stance time - left  General Gait Details Pt ambulated with RW and min guard assist +2 for recliner follow for safety, no physical assist required or overt LOB noted. Pt took multiple standing rest breaks to allow for recovery of SpO2. HR ranged 88-111 and SpO2 88-94% on RA, responded to rest breaks and pursed lip breathing.  Gait velocity decreased  Balance  Overall balance assessment Needs assistance  Sitting-balance support Feet supported;No upper extremity supported  Sitting balance-Leahy Scale Fair  Standing balance support Reliant on assistive device for balance;During functional activity;Bilateral upper extremity supported  Standing balance-Leahy Scale Poor  General Comments  General comments (skin integrity, edema, etc.) Wife SLorenza Chickpresent for session. SpO2 monitored, pt on 2L via [place] at entry and exit, RA during ambulation  Exercises  Exercises Total Joint  Total Joint Exercises  Ankle Circles/Pumps AROM;Both;10 reps;Seated  Quad Sets AROM;Both;10 reps  Short Arc Quad AROM;Left;10 reps  Long [employer]Other (comment) (educated not performed)  Heel Slides AAROM;10 reps;Seated  Hip ABduction/ADduction AAROM;10 reps;Left  Knee Flexion Other (comment) (educated not performed)  Marching in Standing Other (comment) (educated not performed)  Standing Hip Extension Other (comment) (educated not performed)  PT - End of Session  Equipment Utilized During Treatment  Gait belt;Oxygen (O2 at entry and exit, but RA during  ambulation)  Activity Tolerance Patient tolerated treatment well;No increased pain  Patient left in chair;with call bell/phone within reach;with chair alarm set;with nursing/sitter in room;with family/visitor present  Nurse Communication Mobility status;Other (comment) (SpO2)   PT - Assessment/Plan  PT Plan Current plan remains appropriate  PT Visit Diagnosis Pain;Difficulty in walking, not elsewhere classified (R26.2)  Pain - Right/Left Left  Pain - part of body Hip  PT Frequency (ACUTE ONLY) 7X/week  Follow Up Recommendations Follow physician's recommendations for discharge plan and follow up therapies  Assistance recommended at discharge Set up Supervision/Assistance  Patient can return home with the following A little help with walking and/or transfers;A little help with bathing/dressing/bathroom;Assistance with cooking/housework;Assist for transportation;Help with stairs or ramp for entrance  PT equipment Rolling walker (2 wheels)  AM-PAC PT "6 Clicks" Mobility Outcome Measure (Version 2)  Help needed turning from your back to your side while in a flat bed without using bedrails? 4  Help needed moving from lying on your back to sitting on the side of a flat bed without using bedrails? 4  Help needed moving to and from a bed to a chair (including a wheelchair)? 3  Help needed standing up from a chair using your arms (e.g., wheelchair or bedside chair)? 3  Help needed to walk in hospital room? 3  Help needed climbing 3-5 steps with a railing?  3  6 Click Score 20  Consider Recommendation of Discharge To: Home with no services  Progressive Mobility  What is the highest level of mobility based on the progressive mobility assessment? Level 5 (Walks with assist in room/hall) - Balance while stepping forward/back and can walk in room with assist - Complete  Activity Ambulated with assistance in hallway  PT Goal Progression  Progress towards PT goals Progressing toward goals  Acute Rehab PT  Goals  PT Goal Formulation With patient  Time For Goal Achievement 07/23/21  Potential to Achieve Goals Good  PT Time Calculation  PT Start Time (ACUTE ONLY) 1406  PT Stop Time (ACUTE ONLY) 1450  PT Time Calculation (min) (ACUTE ONLY) 44 min  PT General Charges  $$ ACUTE PT VISIT 1 Visit  PT Treatments  $Gait Training 8-22 mins  $Therapeutic Exercise 23-37 mins   Coolidge Breeze, PT, DPT [facility] Office: [phone] Pager: [phone]

## 2021-07-16 NOTE — Evaluation (Signed)
Physical Therapy Evaluation Patient Details Name: Jose Clay MRN: [id number] DOB: 02-Mar-1955 Today's Date: 07/16/2021  History of Present Illness  Pt is a 66yo male presenting s/p L-THA, AA on 07/15/21, admitted to fourth floor for telemetry. PMH: OA, CAD s/p Cath, asthma, HTN, GERD, Hx of MI 2017, OSA on CPAP, R-THA 2017, R-RCR 2014.  Clinical Impression  Jose Clay is a 66 y.o. male POD1 s/p L-THA, AA . Patient reports modified independence using walking stick for mobility at baseline. Patient is now limited by functional impairments (see PT problem list below) and requires min guard for transfers and gait with RW. Patient was able to ambulate 40 feet with RW and min guard assist. Patient instructed in exercise to facilitate ROM and circulation to manage edema as well as incentive spirometry. Patient will benefit from continued skilled PT interventions to address impairments and progress towards PLOF. Acute PT will follow to progress mobility and stair training in preparation for safe discharge home.       Recommendations for follow up therapy are one component of a multi-disciplinary discharge planning process, led by the attending physician.  Recommendations may be updated based on patient status, additional functional criteria and insurance authorization.  Follow Up Recommendations Follow physician's recommendations for discharge plan and follow up therapies    Assistance Recommended at Discharge Set up Supervision/Assistance  Patient can return home with the following  A little help with walking and/or transfers;A little help with bathing/dressing/bathroom;Assistance with cooking/housework;Assist for transportation;Help with stairs or ramp for entrance    Equipment Recommendations Rolling walker (2 wheels)  Recommendations for Other Services       Functional Status Assessment Patient has had a recent decline in their functional status and demonstrates the ability to make  significant improvements in function in a reasonable and predictable amount of time.     Precautions / Restrictions Precautions Precautions: Fall Restrictions Weight Bearing Restrictions: Yes LLE Weight Bearing: Weight bearing as tolerated      Mobility  Bed Mobility Overal bed mobility: Needs Assistance Bed Mobility: Supine to Sit     Supine to sit: Supervision     General bed mobility comments: for safety only    Transfers Overall transfer level: Needs assistance Equipment used: Rolling walker (2 wheels) Transfers: Sit to/from Stand Sit to Stand: Min guard           General transfer comment: For safety only, no physical assist required    Ambulation/Gait Ambulation/Gait assistance: Min guard, +2 safety/equipment Gait Distance (Feet): 40 Feet Assistive device: Rolling walker (2 wheels) Gait Pattern/deviations: Step-to pattern, Decreased stance time - left Gait velocity: decreased     General Gait Details: Pt ambulated with RW and min guard assist +2 for recliner follow for safety, no physical assist required or overt LOB noted. Pt took single standing rest break at ~32f. HR ranged 88-107 and SpO2 88-95%.  Stairs            Wheelchair Mobility    Modified Rankin (Stroke Patients Only)       Balance Overall balance assessment: Needs assistance Sitting-balance support: Feet supported, No upper extremity supported Sitting balance-Leahy Scale: Fair     Standing balance support: Reliant on assistive device for balance, During functional activity, Bilateral upper extremity supported Standing balance-Leahy Scale: Poor                               Pertinent Vitals/Pain Pain Assessment  Pain Assessment: 0-10 Pain Score: 5  Pain Location: L hip Pain Descriptors / Indicators: Operative site guarding, Pins and needles, Burning Pain Intervention(s): Limited activity within patient's tolerance, Monitored during session, Repositioned     Home Living Family/patient expects to be discharged to:: Private residence Living Arrangements: Spouse/significant other Available Help at Discharge: Family;Available 24 hours/day Type of Home: House Home Access: Stairs to enter   [employer] of Steps: 1   Home Layout: One level Home Equipment: Cane - single point;Shower seat      Prior Function Prior Level of Function : Independent/Modified Independent             Mobility Comments: uses walking stick ADLs Comments: help putting on shoes     Hand Dominance        Extremity/Trunk Assessment   Upper Extremity Assessment Upper Extremity Assessment: Overall WFL for tasks assessed    Lower Extremity Assessment Lower Extremity Assessment: RLE deficits/detail;LLE deficits/detail RLE Deficits / Details: MMT ank DF/PF 5/5 RLE Sensation: WNL LLE Deficits / Details: MMT ank DF/PF 5/5 LLE Sensation: WNL    Cervical / Trunk Assessment Cervical / Trunk Assessment: Normal  Communication   Communication: No difficulties  Cognition Arousal/Alertness: Awake/alert Behavior During Therapy: WFL for tasks assessed/performed Overall Cognitive Status: Within Functional Limits for tasks assessed                                          General Comments General comments (skin integrity, edema, etc.): Wife star present for session    Exercises Total Joint Exercises Ankle Circles/Pumps: AROM, Both, 10 reps, Seated Other Exercises Other Exercises: Incentive spirometry x5, cues for slow and controlled, pt made it to 1234m.   Assessment/Plan    PT Assessment Patient needs continued PT services  PT Problem List Decreased strength;Decreased range of motion;Decreased activity tolerance;Decreased balance;Decreased mobility;Decreased coordination;Decreased knowledge of use of DME;Pain       PT Treatment Interventions DME instruction;Gait training;Stair training;Functional mobility  training;Therapeutic activities;Therapeutic exercise;Balance training;Neuromuscular re-education;Patient/family education    PT Goals (Current goals can be found in the Care Plan section)  Acute Rehab PT Goals Patient Stated Goal: [place] (two flights) PT Goal Formulation: With patient Time For Goal Achievement: 07/23/21 Potential to Achieve Goals: Good    Frequency 7X/week     Co-evaluation               AM-PAC PT "6 Clicks" Mobility  Outcome Measure Help needed turning from your back to your side while in a flat bed without using bedrails?: None Help needed moving from lying on your back to sitting on the side of a flat bed without using bedrails?: None Help needed moving to and from a bed to a chair (including a wheelchair)?: A Little Help needed standing up from a chair using your arms (e.g., wheelchair or bedside chair)?: A Little Help needed to walk in hospital room?: A Little Help needed climbing 3-5 steps with a railing? : A Little 6 Click Score: 20    End of Session Equipment Utilized During Treatment: Gait belt Activity Tolerance: Patient tolerated treatment well;No increased pain Patient left: in chair;with call bell/phone within reach;with chair alarm set;with nursing/sitter in room;with family/visitor present Nurse Communication: Mobility status;Other (comment) (vitals) PT Visit Diagnosis: Pain;Difficulty in walking, not elsewhere classified (R26.2) Pain - Right/Left: Left Pain - part of body: Hip  Time: 1696-7893 PT Time Calculation (min) (ACUTE ONLY): 31 min   Charges:   PT Evaluation $PT Eval Low Complexity: 1 Low PT Treatments $Gait Training: 8-22 mins        Coolidge Breeze, PT, DPT [facility] Office: [phone] Pager: [phone]  Nakhi Choi 07/16/2021, 11:18 AM
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
## 2021-08-24 DIAGNOSIS — J189 Pneumonia, unspecified organism: Secondary | ICD-10-CM | POA: Insufficient documentation

## 2021-08-24 DIAGNOSIS — Z5189 Encounter for other specified aftercare: Secondary | ICD-10-CM | POA: Diagnosis not present

## 2021-08-24 DIAGNOSIS — R7303 Prediabetes: Secondary | ICD-10-CM | POA: Insufficient documentation

## 2021-08-24 DIAGNOSIS — M1990 Unspecified osteoarthritis, unspecified site: Secondary | ICD-10-CM | POA: Insufficient documentation

## 2021-08-25 ENCOUNTER — Telehealth: Payer: Self-pay | Admitting: Cardiology

## 2021-08-25 NOTE — Telephone Encounter (Signed)
Spoke with pt and gave recommendation as per Dr. Geraldo Pitter. Pt verbalized understanding and had no additional questions.

## 2021-08-25 NOTE — Telephone Encounter (Signed)
Patient's wife called and said that patient currently takes aspirin EC 81 MG tablet; but was recently put on naproxen 500 MG tablet and they said that he cannot take both. Wants to know when he should stop taking the aspirin EC 81 MG tablet

## 2021-08-26 ENCOUNTER — Ambulatory Visit: Payer: Medicare Other | Admitting: Cardiology

## 2021-10-07 DIAGNOSIS — G5762 Lesion of plantar nerve, left lower limb: Secondary | ICD-10-CM | POA: Insufficient documentation

## 2021-10-07 DIAGNOSIS — M79672 Pain in left foot: Secondary | ICD-10-CM | POA: Diagnosis not present

## 2021-10-07 HISTORY — DX: Lesion of plantar nerve, left lower limb: G57.62

## 2021-10-20 DIAGNOSIS — H524 Presbyopia: Secondary | ICD-10-CM | POA: Diagnosis not present

## 2021-11-04 ENCOUNTER — Other Ambulatory Visit: Payer: Self-pay | Admitting: Cardiology

## 2021-11-04 DIAGNOSIS — J45909 Unspecified asthma, uncomplicated: Secondary | ICD-10-CM | POA: Diagnosis not present

## 2021-11-04 DIAGNOSIS — R079 Chest pain, unspecified: Secondary | ICD-10-CM | POA: Diagnosis not present

## 2021-11-04 DIAGNOSIS — J309 Allergic rhinitis, unspecified: Secondary | ICD-10-CM | POA: Diagnosis not present

## 2021-11-04 DIAGNOSIS — Z23 Encounter for immunization: Secondary | ICD-10-CM | POA: Diagnosis not present

## 2021-11-04 DIAGNOSIS — I2510 Atherosclerotic heart disease of native coronary artery without angina pectoris: Secondary | ICD-10-CM | POA: Diagnosis not present

## 2021-11-04 NOTE — Telephone Encounter (Signed)
Rx refill sent to pharmacy. 
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
## 2021-11-06 ENCOUNTER — Encounter: Payer: Self-pay | Admitting: Cardiology

## 2021-11-06 ENCOUNTER — Ambulatory Visit: Payer: Medicare Other | Attending: Cardiology | Admitting: Cardiology

## 2021-11-06 VITALS — BP 130/80 | HR 61 | Ht 72.0 in | Wt 253.0 lb

## 2021-11-06 DIAGNOSIS — E782 Mixed hyperlipidemia: Secondary | ICD-10-CM | POA: Diagnosis not present

## 2021-11-06 DIAGNOSIS — I2510 Atherosclerotic heart disease of native coronary artery without angina pectoris: Secondary | ICD-10-CM | POA: Diagnosis not present

## 2021-11-06 DIAGNOSIS — I10 Essential (primary) hypertension: Secondary | ICD-10-CM | POA: Diagnosis not present

## 2021-11-06 DIAGNOSIS — Q245 Malformation of coronary vessels: Secondary | ICD-10-CM | POA: Diagnosis not present

## 2021-11-06 MED ORDER — METOPROLOL SUCCINATE ER 25 MG PO TB24: 25.0000 mg | ORAL_TABLET | Freq: Every day | ORAL | 3 refills | Status: DC

## 2021-11-06 NOTE — Patient Instructions (Signed)
Medication Instructions:  Your physician has recommended you make the following change in your medication:   Start Toprol XL 25 mg  take 12.5 mg (1/2 tablet) daily.   *If you need a refill on your cardiac medications before your next appointment, please call your pharmacy*   Lab Work: Your physician recommends that you have labs done in the office today. Your test included complete metabolic panel, LPa and lipids.   If you have labs (blood work) drawn today and your tests are completely normal, you will receive your results only by: [place] (if you have MyChart) OR A paper copy in the mail If you have any lab test that is abnormal or we need to change your treatment, we will call you to review the results.   Testing/Procedures:   Your cardiac CT will be scheduled at one of the below locations:   [facility] [address] [place], [place] [address] [phone]   At [facility], please arrive at the [facility] and Children's Entrance (Entrance C2) of [facility] 30 minutes prior to test start time. You can use the FREE valet parking offered at entrance C (encouraged to control the heart rate for the test)  Proceed to the [facility] Radiology Department (first floor) to check-in and test prep.  All radiology patients and guests should use entrance C2 at [facility], accessed from [facility], even though the hospital's physical address listed is [address]      Please follow these instructions carefully (unless otherwise directed):  Hold all erectile dysfunction medications at least 3 days (72 hrs) prior to test.  On the Night Before the Test: Be sure to Drink plenty of water. Do not consume any caffeinated/decaffeinated beverages or chocolate 12 hours prior to your test. Do not take any antihistamines 12 hours prior to your test.   On the Day of the Test: Drink plenty of water until 1 hour prior to  the test. Do not eat any food 4 hours prior to the test. You may take your regular medications prior to the test.  Take metoprolol (Toprol XL) two hours prior to test.      After the Test: Drink plenty of water. After receiving IV contrast, you may experience a mild flushed feeling. This is normal. On occasion, you may experience a mild rash up to 24 hours after the test. This is not dangerous. If this occurs, you can take Benadryl 25 mg and increase your fluid intake. If you experience trouble breathing, this can be serious. If it is severe call 911 IMMEDIATELY. If it is mild, please call our office. If you take any of these medications: Glipizide/Metformin, Avandament, Glucavance, please do not take 48 hours after completing test unless otherwise instructed.  We will call to schedule your test 2-4 weeks out understanding that some insurance companies will need an authorization prior to the service being performed.   For non-scheduling related questions, please contact the cardiac imaging nurse navigator should you have any questions/concerns: Marchia Bond, Cardiac Imaging Nurse Navigator Gordy Clement, Cardiac Imaging Nurse Navigator [facility] Direct Office Dial: [phone]   For scheduling needs, including cancellations and rescheduling, please call [place], [phone].    Follow-Up: At [facility], you and your health needs are our priority.  As part of our continuing mission to provide you with exceptional heart care, we have created designated Provider Care Teams.  These Care Teams include  your primary Cardiologist (physician) and Advanced Practice Providers (APPs -  Physician Assistants and Nurse Practitioners) who all work together to provide you with the care you need, when you need it.  We recommend signing up for the patient portal called "MyChart".  Sign up information is provided on this After Visit Summary.  MyChart is used to connect with  patients for Virtual Visits (Telemedicine).  Patients are able to view lab/test results, encounter notes, upcoming appointments, etc.  Non-urgent messages can be sent to your provider as well.   To learn more about what you can do with MyChart, go to [email or web address].    Your next appointment:   3 month(s)  The format for your next appointment:   In Person  Provider:   Shirlee More, MD   Other Instructions Cardiac CT Angiogram A cardiac CT angiogram is a procedure to look at the heart and the area around the heart. It may be done to help find the cause of chest pains or other symptoms of heart disease. During this procedure, a substance called contrast dye is injected into the blood vessels in the area to be checked. A large X-ray machine, called a CT scanner, then takes detailed pictures of the heart and the surrounding area. The procedure is also sometimes called a coronary CT angiogram, coronary artery scanning, or CTA. A cardiac CT angiogram allows the health care provider to see how well blood is flowing to and from the heart. The health care provider will be able to see if there are any problems, such as: Blockage or narrowing of the coronary arteries in the heart. Fluid around the heart. Signs of weakness or disease in the muscles, valves, and tissues of the heart. Tell a health care provider about: Any allergies you have. This is especially important if you have had a previous allergic reaction to contrast dye. All medicines you are taking, including vitamins, herbs, eye drops, creams, and over-the-counter medicines. Any blood disorders you have. Any surgeries you have had. Any medical conditions you have. Whether you are pregnant or may be pregnant. Any anxiety disorders, chronic pain, or other conditions you have that may increase your stress or prevent you from lying still. What are the risks? Generally, this is a safe procedure. However, problems may occur,  including: Bleeding. Infection. Allergic reactions to medicines or dyes. Damage to other structures or organs. Kidney damage from the contrast dye that is used. Increased risk of cancer from radiation exposure. This risk is low. Talk with your health care provider about: The risks and benefits of testing. How you can receive the lowest dose of radiation. What happens before the procedure? Wear comfortable clothing and remove any jewelry, glasses, dentures, and hearing aids. Follow instructions from your health care provider about eating and drinking. This may include: For 12 hours before the procedure -- avoid caffeine. This includes tea, coffee, soda, energy drinks, and diet pills. Drink plenty of water or other fluids that do not have caffeine in them. Being well hydrated can prevent complications. For 4-6 hours before the procedure -- stop eating and drinking. The contrast dye can cause nausea, but this is less likely if your stomach is empty. Ask your health care provider about changing or stopping your regular medicines. This is especially important if you are taking diabetes medicines, blood thinners, or medicines to treat problems with erections (erectile dysfunction). What happens during the procedure?  Hair on your chest may need to be removed so that small sticky  patches called electrodes can be placed on your chest. These will transmit information that helps to monitor your heart during the procedure. An IV will be inserted into one of your veins. You might be given a medicine to control your heart rate during the procedure. This will help to ensure that good images are obtained. You will be asked to lie on an exam table. This table will slide in and out of the CT machine during the procedure. Contrast dye will be injected into the IV. You might feel warm, or you may get a metallic taste in your mouth. You will be given a medicine called nitroglycerin. This will relax or dilate the  arteries in your heart. The table that you are lying on will move into the CT machine tunnel for the scan. The person running the machine will give you instructions while the scans are being done. You may be asked to: Keep your arms above your head. Hold your breath. Stay very still, even if the table is moving. When the scanning is complete, you will be moved out of the machine. The IV will be removed. The procedure may vary among health care providers and hospitals. What can I expect after the procedure? After your procedure, it is common to have: A metallic taste in your mouth from the contrast dye. A feeling of warmth. A headache from the nitroglycerin. Follow these instructions at home: Take over-the-counter and prescription medicines only as told by your health care provider. If you are told, drink enough fluid to keep your urine pale yellow. This will help to flush the contrast dye out of your body. Most people can return to their normal activities right after the procedure. Ask your health care provider what activities are safe for you. It is up to you to get the results of your procedure. Ask your health care provider, or the department that is doing the procedure, when your results will be ready. Keep all follow-up visits as told by your health care provider. This is important. Contact a health care provider if: You have any symptoms of allergy to the contrast dye. These include: Shortness of breath. Rash or hives. A racing heartbeat. Summary A cardiac CT angiogram is a procedure to look at the heart and the area around the heart. It may be done to help find the cause of chest pains or other symptoms of heart disease. During this procedure, a large X-ray machine, called a CT scanner, takes detailed pictures of the heart and the surrounding area after a contrast dye has been injected into blood vessels in the area. Ask your health care provider about changing or stopping your  regular medicines before the procedure. This is especially important if you are taking diabetes medicines, blood thinners, or medicines to treat erectile dysfunction. If you are told, drink enough fluid to keep your urine pale yellow. This will help to flush the contrast dye out of your body. This information is not intended to replace advice given to you by your health care provider. Make sure you discuss any questions you have with your health care provider. Document Revised: 09/27/2018 Document Reviewed: 09/27/2018 Elsevier Patient Education  [place].

## 2021-11-06 NOTE — Addendum Note (Signed)
Addended by: Truddie Hidden on: 11/06/2021 11:42 AM   Modules accepted: Orders

## 2021-11-10 LAB — COMPREHENSIVE METABOLIC PANEL
ALT: 32 IU/L (ref 0–44)
AST: 30 IU/L (ref 0–40)
Albumin/Globulin Ratio: 1.8 (ref 1.2–2.2)
Albumin: 4.9 g/dL (ref 3.9–4.9)
Alkaline Phosphatase: 85 IU/L (ref 44–121)
BUN/Creatinine Ratio: 13 (ref 10–24)
BUN: 15 mg/dL (ref 8–27)
Bilirubin Total: 0.3 mg/dL (ref 0.0–1.2)
CO2: 25 mmol/L (ref 20–29)
Calcium: 10 mg/dL (ref 8.6–10.2)
Chloride: 100 mmol/L (ref 96–106)
Creatinine, Ser: 1.2 mg/dL (ref 0.76–1.27)
Globulin, Total: 2.7 g/dL (ref 1.5–4.5)
Glucose: 111 mg/dL — ABNORMAL HIGH (ref 70–99)
Potassium: 5 mmol/L (ref 3.5–5.2)
Sodium: 139 mmol/L (ref 134–144)
Total Protein: 7.6 g/dL (ref 6.0–8.5)
eGFR: 67 mL/min/{1.73_m2} (ref >59)

## 2021-11-10 LAB — LIPID PANEL
Chol/HDL Ratio: 3 ratio (ref 0.0–5.0)
Cholesterol, Total: 157 mg/dL (ref 100–199)
HDL: 53 mg/dL (ref >39)
LDL Chol Calc (NIH): 88 mg/dL (ref 0–99)
Triglycerides: 87 mg/dL (ref 0–149)
VLDL Cholesterol Cal: 16 mg/dL (ref 5–40)

## 2021-11-10 LAB — LIPOPROTEIN A (LPA): Lipoprotein (a): <8.4 nmol/L (ref <75.0)

## 2021-11-23 ENCOUNTER — Telehealth: Payer: Self-pay | Admitting: Cardiology

## 2021-11-23 NOTE — Telephone Encounter (Signed)
Pt's wife would like a callback in regards to Test that pt is having on Wednesday. She would like to know if there are any special instructions. Please advise

## 2021-11-23 NOTE — Telephone Encounter (Signed)
Pts wife verbalized understanding of his instructions and I have also sent to his My [place] for their review. They will call back if they have any further questions.

## 2021-11-24 ENCOUNTER — Telehealth (HOSPITAL_COMMUNITY): Payer: Self-pay | Admitting: Emergency Medicine

## 2021-11-24 ENCOUNTER — Telehealth: Payer: Self-pay | Admitting: Cardiology

## 2021-11-24 NOTE — Telephone Encounter (Signed)
Reaching out to patient to offer assistance regarding upcoming cardiac imaging study; pt verbalizes understanding of appt date/time, parking situation and where to check in, pre-test NPO status and medications ordered, and verified current allergies; name and call back number provided for further questions should they arise Jose Bond RN Navigator Cardiac Imaging [facility] [phone] office [phone] cell  Arrival 100 w/c entrance  Holding hyzaar '25mg'$  metoprolol succ 2h prior Denies iv issues Aware nitro

## 2021-11-24 NOTE — Telephone Encounter (Signed)
Wife stated patient is scheduled for a CT Cardiac Morphology test tomorrow and to have a Lidocaine injection in his foot.  Wife would like to know if the patient can still get the injection tomorrow or would he need to postpone the injection.

## 2021-11-25 ENCOUNTER — Ambulatory Visit (HOSPITAL_COMMUNITY)
Admission: RE | Admit: 2021-11-25 | Discharge: 2021-11-25 | Disposition: A | Discharge status: Home / Self Care | Payer: Medicare Other | Source: Ambulatory Visit | Attending: Cardiology | Admitting: Cardiology

## 2021-11-25 ENCOUNTER — Other Ambulatory Visit: Payer: Self-pay | Admitting: Cardiovascular Disease

## 2021-11-25 ENCOUNTER — Ambulatory Visit (HOSPITAL_BASED_OUTPATIENT_CLINIC_OR_DEPARTMENT_OTHER)
Admission: RE | Admit: 2021-11-25 | Discharge: 2021-11-25 | Disposition: A | Discharge status: Home / Self Care | Payer: Medicare Other | Source: Ambulatory Visit | Attending: Cardiovascular Disease | Admitting: Cardiovascular Disease

## 2021-11-25 DIAGNOSIS — R931 Abnormal findings on diagnostic imaging of heart and coronary circulation: Secondary | ICD-10-CM

## 2021-11-25 DIAGNOSIS — I2510 Atherosclerotic heart disease of native coronary artery without angina pectoris: Secondary | ICD-10-CM

## 2021-11-25 DIAGNOSIS — Q245 Malformation of coronary vessels: Secondary | ICD-10-CM | POA: Insufficient documentation

## 2021-11-25 DIAGNOSIS — G5762 Lesion of plantar nerve, left lower limb: Secondary | ICD-10-CM | POA: Diagnosis not present

## 2021-11-25 MED ORDER — IOHEXOL 350 MG/ML SOLN
100.0000 mL | Freq: Once | INTRAVENOUS | Status: AC | PRN
  Administered 2021-11-25: 100 mL via INTRAVENOUS

## 2021-11-25 MED ORDER — NITROGLYCERIN 0.4 MG SL SUBL
SUBLINGUAL_TABLET | SUBLINGUAL | Status: AC
  Filled 2021-11-25: qty 2

## 2021-11-25 MED ORDER — METOPROLOL TARTRATE 5 MG/5ML IV SOLN
5.0000 mg | INTRAVENOUS | Status: DC | PRN
  Administered 2021-11-25: 5 mg via INTRAVENOUS

## 2021-11-25 MED ORDER — NITROGLYCERIN 0.4 MG SL SUBL
0.8000 mg | SUBLINGUAL_TABLET | SUBLINGUAL | Status: DC | PRN
  Administered 2021-11-25: 0.8 mg via SUBLINGUAL

## 2021-11-25 MED ORDER — METOPROLOL TARTRATE 5 MG/5ML IV SOLN
INTRAVENOUS | Status: AC
  Filled 2021-11-25: qty 5

## 2021-11-26 ENCOUNTER — Ambulatory Visit (HOSPITAL_COMMUNITY)
Admission: RE | Admit: 2021-11-26 | Discharge: 2021-11-26 | Disposition: A | Discharge status: Home / Self Care | Payer: Medicare Other | Source: Ambulatory Visit | Attending: Cardiovascular Disease | Admitting: Cardiovascular Disease

## 2021-11-26 DIAGNOSIS — I2510 Atherosclerotic heart disease of native coronary artery without angina pectoris: Secondary | ICD-10-CM | POA: Diagnosis not present

## 2021-12-01 ENCOUNTER — Telehealth: Payer: Self-pay

## 2021-12-01 NOTE — Telephone Encounter (Signed)
Patient notified of results.

## 2021-12-01 NOTE — Telephone Encounter (Signed)
-----   Message from Richardo Priest, MD sent at 11/26/2021  8:04 AM EDT ----- This is a stable result he has a moderate obstruction in the left anterior descending but is not restricting blood flow he has stable heart disease at this time I would not do a repeat coronary angiogram

## 2021-12-08 ENCOUNTER — Ambulatory Visit: Payer: Medicare Other | Admitting: Cardiology

## 2021-12-14 ENCOUNTER — Other Ambulatory Visit: Payer: Self-pay | Admitting: Cardiology

## 2021-12-14 NOTE — Telephone Encounter (Signed)
Rx refill sent to pharmacy. 

## 2022-01-06 DIAGNOSIS — G5762 Lesion of plantar nerve, left lower limb: Secondary | ICD-10-CM | POA: Diagnosis not present

## 2022-01-06 DIAGNOSIS — R202 Paresthesia of skin: Secondary | ICD-10-CM | POA: Insufficient documentation

## 2022-01-06 HISTORY — DX: Paresthesia of skin: R20.2

## 2022-01-15 DIAGNOSIS — I10 Essential (primary) hypertension: Secondary | ICD-10-CM | POA: Diagnosis not present

## 2022-01-15 DIAGNOSIS — E7800 Pure hypercholesterolemia, unspecified: Secondary | ICD-10-CM | POA: Diagnosis not present

## 2022-01-15 DIAGNOSIS — G4733 Obstructive sleep apnea (adult) (pediatric): Secondary | ICD-10-CM | POA: Diagnosis not present

## 2022-01-15 DIAGNOSIS — R7303 Prediabetes: Secondary | ICD-10-CM | POA: Diagnosis not present

## 2022-01-15 DIAGNOSIS — I2510 Atherosclerotic heart disease of native coronary artery without angina pectoris: Secondary | ICD-10-CM | POA: Diagnosis not present

## 2022-01-29 DIAGNOSIS — M542 Cervicalgia: Secondary | ICD-10-CM | POA: Diagnosis not present

## 2022-01-29 DIAGNOSIS — M25512 Pain in left shoulder: Secondary | ICD-10-CM | POA: Diagnosis not present
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
## 2022-02-05 ENCOUNTER — Ambulatory Visit: Payer: Medicare Other | Attending: Cardiology | Admitting: Cardiology

## 2022-02-05 ENCOUNTER — Encounter: Payer: Self-pay | Admitting: Cardiology

## 2022-02-05 VITALS — BP 156/88 | HR 65 | Ht 72.0 in | Wt 258.0 lb

## 2022-02-05 DIAGNOSIS — Q245 Malformation of coronary vessels: Secondary | ICD-10-CM | POA: Diagnosis not present

## 2022-02-05 DIAGNOSIS — I2510 Atherosclerotic heart disease of native coronary artery without angina pectoris: Secondary | ICD-10-CM | POA: Diagnosis not present

## 2022-02-05 DIAGNOSIS — E782 Mixed hyperlipidemia: Secondary | ICD-10-CM

## 2022-02-05 DIAGNOSIS — I10 Essential (primary) hypertension: Secondary | ICD-10-CM

## 2022-02-05 MED ORDER — EZETIMIBE 10 MG PO TABS: 10.0000 mg | ORAL_TABLET | Freq: Every day | ORAL | 3 refills | Status: DC

## 2022-02-05 MED ORDER — ATORVASTATIN CALCIUM 40 MG PO TABS: 40.0000 mg | ORAL_TABLET | Freq: Every day | ORAL | 3 refills | Status: DC

## 2022-02-05 NOTE — Patient Instructions (Signed)
Medication Instructions:  Your physician has recommended you make the following change in your medication:  Increase Atorvastatin to 40 mg once daily Start Ezetimibe 10 mg once daily  *If you need a refill on your cardiac medications before your next appointment, please call your pharmacy*   Lab Work: Your physician recommends that you return for lab work in: Today for [place] in 2 months Lab opens at [facility] an appointment. Best time to come is between 8am and 12noon and between 1:30 and 4:30. If you have been asked to fast for your blood work please have nothing to eat or drink after midnight. You may have water.   If you have labs (blood work) drawn today and your tests are completely normal, you will receive your results only by: [place] (if you have MyChart) OR A paper copy in the mail If you have any lab test that is abnormal or we need to change your treatment, we will call you to review the results.   Testing/Procedures: NONE   Follow-Up: At [facility], you and your health needs are our priority.  As part of our continuing mission to provide you with exceptional heart care, we have created designated Provider Care Teams.  These Care Teams include your primary Cardiologist (physician) and Advanced Practice Providers (APPs -  Physician Assistants and Nurse Practitioners) who all work together to provide you with the care you need, when you need it.  We recommend signing up for the patient portal called "MyChart".  Sign up information is provided on this After Visit Summary.  MyChart is used to connect with patients for Virtual Visits (Telemedicine).  Patients are able to view lab/test results, encounter notes, upcoming appointments, etc.  Non-urgent messages can be sent to your provider as well.   To learn more about what you can do with MyChart, go to [email or web address].    Your next appointment:   1 year(s)  The format for your  next appointment:   In Person  Provider:   Shirlee More, MD    Other Instructions   Important Information About Sugar

## 2022-04-01 LAB — LIPID PANEL
Chol/HDL Ratio: 2.5 ratio (ref 0.0–5.0)
Cholesterol, Total: 109 mg/dL (ref 100–199)
HDL: 43 mg/dL (ref >39)
LDL Chol Calc (NIH): 46 mg/dL (ref 0–99)
Triglycerides: 108 mg/dL (ref 0–149)
VLDL Cholesterol Cal: 20 mg/dL (ref 5–40)

## 2022-04-01 LAB — LIPOPROTEIN A (LPA): Lipoprotein (a): 8.4 nmol/L (ref <75.0)

## 2022-04-06 DIAGNOSIS — U071 COVID-19: Secondary | ICD-10-CM | POA: Diagnosis not present

## 2022-04-06 DIAGNOSIS — R6889 Other general symptoms and signs: Secondary | ICD-10-CM | POA: Diagnosis not present

## 2022-04-06 DIAGNOSIS — I2510 Atherosclerotic heart disease of native coronary artery without angina pectoris: Secondary | ICD-10-CM | POA: Diagnosis not present

## 2022-04-06 DIAGNOSIS — J45909 Unspecified asthma, uncomplicated: Secondary | ICD-10-CM | POA: Diagnosis not present

## 2022-04-21 DIAGNOSIS — R7303 Prediabetes: Secondary | ICD-10-CM | POA: Diagnosis not present

## 2022-04-21 DIAGNOSIS — E7800 Pure hypercholesterolemia, unspecified: Secondary | ICD-10-CM | POA: Diagnosis not present

## 2022-04-21 DIAGNOSIS — I10 Essential (primary) hypertension: Secondary | ICD-10-CM | POA: Diagnosis not present

## 2022-04-21 DIAGNOSIS — Z125 Encounter for screening for malignant neoplasm of prostate: Secondary | ICD-10-CM | POA: Diagnosis not present

## 2022-04-21 DIAGNOSIS — I2510 Atherosclerotic heart disease of native coronary artery without angina pectoris: Secondary | ICD-10-CM | POA: Diagnosis not present

## 2022-09-28 DIAGNOSIS — K080 Exfoliation of teeth due to systemic causes: Secondary | ICD-10-CM | POA: Diagnosis not present

## 2022-10-02 ENCOUNTER — Other Ambulatory Visit: Payer: Self-pay | Admitting: Cardiology

## 2022-10-02 DIAGNOSIS — I2510 Atherosclerotic heart disease of native coronary artery without angina pectoris: Secondary | ICD-10-CM

## 2022-11-24 DIAGNOSIS — E7800 Pure hypercholesterolemia, unspecified: Secondary | ICD-10-CM | POA: Diagnosis not present

## 2022-11-24 DIAGNOSIS — Z23 Encounter for immunization: Secondary | ICD-10-CM | POA: Diagnosis not present

## 2022-11-24 DIAGNOSIS — R220 Localized swelling, mass and lump, head: Secondary | ICD-10-CM | POA: Diagnosis not present

## 2022-11-24 DIAGNOSIS — I10 Essential (primary) hypertension: Secondary | ICD-10-CM | POA: Diagnosis not present

## 2022-11-24 DIAGNOSIS — R7303 Prediabetes: Secondary | ICD-10-CM | POA: Diagnosis not present

## 2022-11-24 DIAGNOSIS — I2510 Atherosclerotic heart disease of native coronary artery without angina pectoris: Secondary | ICD-10-CM | POA: Diagnosis not present

## 2022-11-29 DIAGNOSIS — H524 Presbyopia: Secondary | ICD-10-CM | POA: Diagnosis not present

## 2022-12-28 DIAGNOSIS — I10 Essential (primary) hypertension: Secondary | ICD-10-CM | POA: Diagnosis not present

## 2022-12-28 DIAGNOSIS — M26609 Unspecified temporomandibular joint disorder, unspecified side: Secondary | ICD-10-CM | POA: Diagnosis not present

## 2023-01-25 ENCOUNTER — Other Ambulatory Visit: Payer: Self-pay | Admitting: Cardiology

## 2023-02-03 ENCOUNTER — Other Ambulatory Visit: Payer: Self-pay | Admitting: Cardiology
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
## 2023-02-15 ENCOUNTER — Ambulatory Visit: Payer: Medicare Other | Attending: Cardiology | Admitting: Cardiology

## 2023-02-15 ENCOUNTER — Encounter: Payer: Self-pay | Admitting: Cardiology

## 2023-02-15 VITALS — BP 132/78 | HR 63 | Ht 72.0 in | Wt 258.8 lb

## 2023-02-15 DIAGNOSIS — I2510 Atherosclerotic heart disease of native coronary artery without angina pectoris: Secondary | ICD-10-CM | POA: Diagnosis not present

## 2023-02-15 DIAGNOSIS — I119 Hypertensive heart disease without heart failure: Secondary | ICD-10-CM

## 2023-02-15 DIAGNOSIS — E782 Mixed hyperlipidemia: Secondary | ICD-10-CM | POA: Diagnosis not present

## 2023-02-15 DIAGNOSIS — J4520 Mild intermittent asthma, uncomplicated: Secondary | ICD-10-CM

## 2023-02-15 NOTE — Patient Instructions (Signed)
 Medication Instructions:  Your physician recommends that you continue on your current medications as directed. Please refer to the Current Medication list given to you today.  *If you need a refill on your cardiac medications before your next appointment, please call your pharmacy*   Lab Work: None Ordered If you have labs (blood work) drawn today and your tests are completely normal, you will receive your results only by: MyChart Message (if you have MyChart) OR A paper copy in the mail If you have any lab test that is abnormal or we need to change your treatment, we will call you to review the results.   Testing/Procedures: None Ordered   Follow-Up: At [facility], you and your health needs are our priority.  As part of our continuing mission to provide you with exceptional heart care, we have created designated Provider Care Teams.  These Care Teams include your primary Cardiologist (physician) and Advanced Practice Providers (APPs -  Physician Assistants and Nurse Practitioners) who all work together to provide you with the care you need, when you need it.  We recommend signing up for the patient portal called "MyChart".  Sign up information is provided on this After Visit Summary.  MyChart is used to connect with patients for Virtual Visits (Telemedicine).  Patients are able to view lab/test results, encounter notes, upcoming appointments, etc.  Non-urgent messages can be sent to your provider as well.   To learn more about what you can do with MyChart, go to [email or web address].    Your next appointment: 1 year follow up

## 2023-02-16 ENCOUNTER — Encounter: Payer: Self-pay | Admitting: Cardiology

## 2023-02-16 DIAGNOSIS — I2510 Atherosclerotic heart disease of native coronary artery without angina pectoris: Secondary | ICD-10-CM

## 2023-03-03 ENCOUNTER — Encounter (HOSPITAL_COMMUNITY): Payer: Self-pay

## 2023-03-07 ENCOUNTER — Other Ambulatory Visit: Payer: Self-pay | Admitting: Cardiology

## 2023-03-10 ENCOUNTER — Ambulatory Visit: Payer: Medicare Other

## 2023-03-22 ENCOUNTER — Ambulatory Visit: Payer: Medicare Other | Attending: Cardiology

## 2023-03-22 DIAGNOSIS — I2510 Atherosclerotic heart disease of native coronary artery without angina pectoris: Secondary | ICD-10-CM | POA: Diagnosis not present

## 2023-03-22 MED ORDER — TECHNETIUM TC 99M TETROFOSMIN IV KIT
31.3000 | PACK | Freq: Once | INTRAVENOUS | Status: AC | PRN
  Administered 2023-03-22: 31.3 via INTRAVENOUS

## 2023-03-22 MED ORDER — TECHNETIUM TC 99M TETROFOSMIN IV KIT
9.8000 | PACK | Freq: Once | INTRAVENOUS | Status: AC | PRN
  Administered 2023-03-22: 9.8 via INTRAVENOUS

## 2023-03-23 LAB — MYOCARDIAL PERFUSION IMAGING
Angina Index: 0
Duke Treadmill Score: 7
Estimated workload: 8.5
Exercise duration (min): 6 min
Exercise duration (sec): 30 s
LV dias vol: 106 mL (ref 62–150)
LV sys vol: 32 mL
MPHR: 153 {beats}/min
Nuc Stress EF: 70 %
Peak HR: 141 {beats}/min
Percent HR: 92 %
Rest HR: 72 {beats}/min
Rest Nuclear Isotope Dose: 9.8 mCi
SDS: 3
SRS: 2
SSS: 5
ST Depression (mm): 0 mm
Stress Nuclear Isotope Dose: 31.3 mCi
TID: 0.99

## 2023-03-24 ENCOUNTER — Telehealth: Payer: Self-pay | Admitting: Cardiology

## 2023-03-24 ENCOUNTER — Telehealth: Payer: Self-pay

## 2023-03-24 NOTE — Telephone Encounter (Signed)
 Pt spouse returning call for results

## 2023-03-24 NOTE — Telephone Encounter (Signed)
 This patient is requesting medication for ED. Please advise.

## 2023-03-24 NOTE — Telephone Encounter (Signed)
 Left message for the patient to call back.

## 2023-03-24 NOTE — Telephone Encounter (Signed)
 Patient's wife Kota Ciancio informed of the patient's results per DPR.

## 2023-03-25 ENCOUNTER — Telehealth: Payer: Self-pay | Admitting: Cardiology

## 2023-03-25 ENCOUNTER — Other Ambulatory Visit: Payer: Self-pay

## 2023-03-25 MED ORDER — TADALAFIL 5 MG PO TABS: 5.0000 mg | ORAL_TABLET | Freq: Every day | ORAL | 0 refills | Status: AC | PRN

## 2023-03-25 NOTE — Telephone Encounter (Signed)
 Called the patient and informed him of Dr. Leandrew recommendation below:  Cialis  No. 10 5 mg daily as needed  Patient verbalized understanding and had no further questions at this time. Cialis  medication was ordered via Epic and sent to the patient's pharmacy.

## 2023-03-25 NOTE — Telephone Encounter (Signed)
 Patient was returning phone call

## 2023-03-31 DIAGNOSIS — K080 Exfoliation of teeth due to systemic causes: Secondary | ICD-10-CM | POA: Diagnosis not present

## 2023-04-08 DIAGNOSIS — I10 Essential (primary) hypertension: Secondary | ICD-10-CM | POA: Diagnosis not present

## 2023-04-08 DIAGNOSIS — R609 Edema, unspecified: Secondary | ICD-10-CM | POA: Diagnosis not present

## 2023-04-08 DIAGNOSIS — N529 Male erectile dysfunction, unspecified: Secondary | ICD-10-CM | POA: Diagnosis not present

## 2023-05-24 DIAGNOSIS — K080 Exfoliation of teeth due to systemic causes: Secondary | ICD-10-CM | POA: Diagnosis not present

## 2023-06-02 ENCOUNTER — Other Ambulatory Visit: Payer: Self-pay | Admitting: Cardiology

## 2023-06-18 DIAGNOSIS — G4733 Obstructive sleep apnea (adult) (pediatric): Secondary | ICD-10-CM | POA: Diagnosis not present

## 2023-09-01 ENCOUNTER — Other Ambulatory Visit: Payer: Self-pay | Admitting: Cardiology

## 2023-09-01 DIAGNOSIS — I2510 Atherosclerotic heart disease of native coronary artery without angina pectoris: Secondary | ICD-10-CM

## 2023-09-16 ENCOUNTER — Emergency Department (HOSPITAL_COMMUNITY)
Admission: EM | Admit: 2023-09-16 | Discharge: 2023-09-16 | Disposition: A | Discharge status: Home / Self Care | Source: Ambulatory Visit

## 2023-09-16 ENCOUNTER — Emergency Department (HOSPITAL_COMMUNITY)

## 2023-09-16 ENCOUNTER — Other Ambulatory Visit: Payer: Self-pay

## 2023-09-16 ENCOUNTER — Encounter (HOSPITAL_COMMUNITY): Payer: Self-pay

## 2023-09-16 DIAGNOSIS — I10 Essential (primary) hypertension: Secondary | ICD-10-CM | POA: Diagnosis not present

## 2023-09-16 DIAGNOSIS — R079 Chest pain, unspecified: Secondary | ICD-10-CM | POA: Diagnosis not present

## 2023-09-16 DIAGNOSIS — R059 Cough, unspecified: Secondary | ICD-10-CM | POA: Insufficient documentation

## 2023-09-16 DIAGNOSIS — R0789 Other chest pain: Secondary | ICD-10-CM | POA: Diagnosis not present

## 2023-09-16 DIAGNOSIS — Z951 Presence of aortocoronary bypass graft: Secondary | ICD-10-CM | POA: Insufficient documentation

## 2023-09-16 DIAGNOSIS — J45909 Unspecified asthma, uncomplicated: Secondary | ICD-10-CM | POA: Insufficient documentation

## 2023-09-16 DIAGNOSIS — Z96643 Presence of artificial hip joint, bilateral: Secondary | ICD-10-CM | POA: Insufficient documentation

## 2023-09-16 DIAGNOSIS — I2510 Atherosclerotic heart disease of native coronary artery without angina pectoris: Secondary | ICD-10-CM | POA: Diagnosis not present

## 2023-09-16 DIAGNOSIS — M25551 Pain in right hip: Secondary | ICD-10-CM | POA: Diagnosis not present

## 2023-09-16 DIAGNOSIS — E7800 Pure hypercholesterolemia, unspecified: Secondary | ICD-10-CM | POA: Diagnosis not present

## 2023-09-16 DIAGNOSIS — Z1331 Encounter for screening for depression: Secondary | ICD-10-CM | POA: Diagnosis not present

## 2023-09-16 DIAGNOSIS — R0602 Shortness of breath: Secondary | ICD-10-CM | POA: Diagnosis not present

## 2023-09-16 LAB — CBC
HCT: 42.8 % (ref 39.0–52.0)
Hemoglobin: 14.6 g/dL (ref 13.0–17.0)
MCH: 31.1 pg (ref 26.0–34.0)
MCHC: 34.1 g/dL (ref 30.0–36.0)
MCV: 91.3 fL (ref 80.0–100.0)
Platelets: 395 K/uL (ref 150–400)
RBC: 4.69 MIL/uL (ref 4.22–5.81)
RDW: 16.3 % — ABNORMAL HIGH (ref 11.5–15.5)
WBC: 6.9 K/uL (ref 4.0–10.5)
nRBC: 0 % (ref 0.0–0.2)

## 2023-09-16 LAB — BASIC METABOLIC PANEL WITH GFR
Anion gap: 6 (ref 5–15)
BUN: 18 mg/dL (ref 8–23)
CO2: 24 mmol/L (ref 22–32)
Calcium: 8.9 mg/dL (ref 8.9–10.3)
Chloride: 107 mmol/L (ref 98–111)
Creatinine, Ser: 1.17 mg/dL (ref 0.61–1.24)
GFR, Estimated: >60 mL/min (ref >60)
Glucose, Bld: 113 mg/dL — ABNORMAL HIGH (ref 70–99)
Potassium: 4.1 mmol/L (ref 3.5–5.1)
Sodium: 137 mmol/L (ref 135–145)

## 2023-09-16 LAB — TROPONIN I (HIGH SENSITIVITY)
Troponin I (High Sensitivity): 4 ng/L (ref <18)
Troponin I (High Sensitivity): 5 ng/L (ref <18)

## 2023-09-16 NOTE — ED Triage Notes (Signed)
 Pt to ED c/o CP intermittent in nature, reports ongoing problem x 3 days. Reports no relief with nitroglycerin .

## 2023-09-16 NOTE — Discharge Instructions (Signed)
 You were seen today for chest pain. While you were here we monitored your vitals, preformed a physical exam, and labs and EKG and chest x-ray. These were all reassuring and there is no indication for any further testing or intervention in the emergency department at this time.   Things to do:  - Follow up with your primary care provider within the next 1-2 weeks - Follow-up with your cardiologist as soon as possible as they may want to repeat your stress test  Return to the emergency department if you have any new or worsening symptoms including worsening chest pain, shortness of breath, leg swelling, difficulty lying flat, or if you have any other concerns.

## 2023-09-16 NOTE — ED Triage Notes (Signed)
 Pt came in via POV d/t Lt sided CP that radiates up into his neck & top of Lt shoulder a few days ago.His PCP gave him 2 Nitroglycerins in his office this morning, after the meds the pain did not subside any. Does have exertional SOB that comes & goes. A/Ox4, rates his pain 5/10 during triage. His PCP said his EKG was normal & they did labs before arriving to ED.

## 2023-09-16 NOTE — ED Provider Triage Note (Signed)
 Emergency Medicine Provider Triage Evaluation Note  Jose Clay , a 68 y.o. male  was evaluated in triage.  Pt complains of chest pain.  Symptoms have been ongoing for several weeks, but have become worse over the past several days.  Patient was seen by his PCP this morning and was told that his EKG was normal, was given 2 nitroglycerin  and instructed to come to the emergency department for further testing.  He reports history of 2 mild heart attacks in the past, with prior heart catheterization 5+ years ago.  He describes the pain as twisting of his left chest with radiation to his left neck, pain comes and goes but has been more persistent over the past several days, is currently present now.  Denies worsening shortness of breath, nausea/vomiting.  Review of Systems  Positive: As above Negative: As above  Physical Exam  BP (!) 174/91 (BP Location: Left Arm)   Pulse 63   Temp 98.3 F (36.8 C)   Resp 14   Ht 5' 11 (1.803 m)   Wt 113.4 kg   SpO2 95%   BMI 34.87 kg/m  Gen:   Awake, no distress   Resp:  Normal effort  MSK:   Moves extremities without difficulty  Other:    Medical Decision Making  Medically screening exam initiated at 12:20 PM.  Appropriate orders placed.  Jose Clay was informed that the remainder of the evaluation will be completed by another provider, this initial triage assessment does not replace that evaluation, and the importance of remaining in the ED until their evaluation is complete.     Jose Clay, [place] 09/16/23 1222
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
## 2023-09-18 DIAGNOSIS — G4733 Obstructive sleep apnea (adult) (pediatric): Secondary | ICD-10-CM | POA: Diagnosis not present

## 2023-12-04 ENCOUNTER — Other Ambulatory Visit: Payer: Self-pay

## 2023-12-04 ENCOUNTER — Emergency Department (HOSPITAL_COMMUNITY)

## 2023-12-04 ENCOUNTER — Emergency Department (HOSPITAL_COMMUNITY)
Admission: EM | Admit: 2023-12-04 | Discharge: 2023-12-04 | Disposition: A | Discharge status: Home / Self Care | Attending: Emergency Medicine | Admitting: Emergency Medicine

## 2023-12-04 DIAGNOSIS — R079 Chest pain, unspecified: Secondary | ICD-10-CM | POA: Insufficient documentation

## 2023-12-04 DIAGNOSIS — M25512 Pain in left shoulder: Secondary | ICD-10-CM | POA: Insufficient documentation

## 2023-12-04 DIAGNOSIS — S3991XA Unspecified injury of abdomen, initial encounter: Secondary | ICD-10-CM | POA: Diagnosis not present

## 2023-12-04 DIAGNOSIS — R1031 Right lower quadrant pain: Secondary | ICD-10-CM | POA: Diagnosis not present

## 2023-12-04 DIAGNOSIS — R1011 Right upper quadrant pain: Secondary | ICD-10-CM | POA: Diagnosis not present

## 2023-12-04 DIAGNOSIS — I10 Essential (primary) hypertension: Secondary | ICD-10-CM | POA: Diagnosis not present

## 2023-12-04 DIAGNOSIS — I2510 Atherosclerotic heart disease of native coronary artery without angina pectoris: Secondary | ICD-10-CM | POA: Insufficient documentation

## 2023-12-04 DIAGNOSIS — Z79899 Other long term (current) drug therapy: Secondary | ICD-10-CM | POA: Diagnosis not present

## 2023-12-04 DIAGNOSIS — S3993XA Unspecified injury of pelvis, initial encounter: Secondary | ICD-10-CM | POA: Diagnosis not present

## 2023-12-04 DIAGNOSIS — S20212A Contusion of left front wall of thorax, initial encounter: Secondary | ICD-10-CM | POA: Diagnosis not present

## 2023-12-04 DIAGNOSIS — S299XXA Unspecified injury of thorax, initial encounter: Secondary | ICD-10-CM | POA: Diagnosis not present

## 2023-12-04 DIAGNOSIS — Y92410 Unspecified street and highway as the place of occurrence of the external cause: Secondary | ICD-10-CM | POA: Insufficient documentation

## 2023-12-04 DIAGNOSIS — S0990XA Unspecified injury of head, initial encounter: Secondary | ICD-10-CM | POA: Diagnosis not present

## 2023-12-04 DIAGNOSIS — M8580 Other specified disorders of bone density and structure, unspecified site: Secondary | ICD-10-CM | POA: Diagnosis not present

## 2023-12-04 DIAGNOSIS — S63502A Unspecified sprain of left wrist, initial encounter: Secondary | ICD-10-CM | POA: Insufficient documentation

## 2023-12-04 DIAGNOSIS — S3011XA Contusion of abdominal wall, initial encounter: Secondary | ICD-10-CM | POA: Diagnosis not present

## 2023-12-04 DIAGNOSIS — M25532 Pain in left wrist: Secondary | ICD-10-CM | POA: Diagnosis not present

## 2023-12-04 DIAGNOSIS — M25551 Pain in right hip: Secondary | ICD-10-CM | POA: Diagnosis not present

## 2023-12-04 DIAGNOSIS — Z7982 Long term (current) use of aspirin: Secondary | ICD-10-CM | POA: Insufficient documentation

## 2023-12-04 DIAGNOSIS — S20219A Contusion of unspecified front wall of thorax, initial encounter: Secondary | ICD-10-CM | POA: Insufficient documentation

## 2023-12-04 DIAGNOSIS — T07XXXA Unspecified multiple injuries, initial encounter: Secondary | ICD-10-CM

## 2023-12-04 LAB — CBC WITH DIFFERENTIAL/PLATELET
Abs Immature Granulocytes: 0.06 K/uL (ref 0.00–0.07)
Basophils Absolute: 0.1 K/uL (ref 0.0–0.1)
Basophils Relative: 1 %
Eosinophils Absolute: 0.2 K/uL (ref 0.0–0.5)
Eosinophils Relative: 2 %
HCT: 44.1 % (ref 39.0–52.0)
Hemoglobin: 14.7 g/dL (ref 13.0–17.0)
Immature Granulocytes: 1 %
Lymphocytes Relative: 12 %
Lymphs Abs: 1.3 K/uL (ref 0.7–4.0)
MCH: 30.8 pg (ref 26.0–34.0)
MCHC: 33.3 g/dL (ref 30.0–36.0)
MCV: 92.5 fL (ref 80.0–100.0)
Monocytes Absolute: 1.3 K/uL — ABNORMAL HIGH (ref 0.1–1.0)
Monocytes Relative: 12 %
Neutro Abs: 8.3 K/uL — ABNORMAL HIGH (ref 1.7–7.7)
Neutrophils Relative %: 72 %
Platelets: 386 K/uL (ref 150–400)
RBC: 4.77 MIL/uL (ref 4.22–5.81)
RDW: 15.6 % — ABNORMAL HIGH (ref 11.5–15.5)
WBC: 11.4 K/uL — ABNORMAL HIGH (ref 4.0–10.5)
nRBC: 0.2 % (ref 0.0–0.2)

## 2023-12-04 LAB — I-STAT CHEM 8, ED
BUN: 16 mg/dL (ref 8–23)
Calcium, Ion: 1.11 mmol/L — ABNORMAL LOW (ref 1.15–1.40)
Chloride: 102 mmol/L (ref 98–111)
Creatinine, Ser: 1.1 mg/dL (ref 0.61–1.24)
Glucose, Bld: 111 mg/dL — ABNORMAL HIGH (ref 70–99)
HCT: 46 % (ref 39.0–52.0)
Hemoglobin: 15.6 g/dL (ref 13.0–17.0)
Potassium: 3.3 mmol/L — ABNORMAL LOW (ref 3.5–5.1)
Sodium: 138 mmol/L (ref 135–145)
TCO2: 21 mmol/L — ABNORMAL LOW (ref 22–32)

## 2023-12-04 MED ORDER — ONDANSETRON HCL 4 MG/2ML IJ SOLN
4.0000 mg | Freq: Once | INTRAMUSCULAR | Status: AC
  Administered 2023-12-04: 4 mg via INTRAVENOUS
  Filled 2023-12-04: qty 2

## 2023-12-04 MED ORDER — FENTANYL CITRATE (PF) 50 MCG/ML IJ SOSY
50.0000 ug | PREFILLED_SYRINGE | Freq: Once | INTRAMUSCULAR | Status: AC
  Administered 2023-12-04: 50 ug via INTRAVENOUS
  Filled 2023-12-04: qty 1

## 2023-12-04 MED ORDER — IOHEXOL 350 MG/ML SOLN
75.0000 mL | Freq: Once | INTRAVENOUS | Status: AC | PRN
  Administered 2023-12-04: 75 mL via INTRAVENOUS
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
## 2023-12-04 NOTE — Progress Notes (Signed)
 Orthopedic Tech Progress Note Patient Details:  Jose Clay Jul 14, 1955 [id number]  Applied wrist splint to LUE, patient toelrated well.  Ortho Devices Type of Ortho Device: Velcro wrist forearm splint Ortho Device/Splint Location: LUE Ortho Device/Splint Interventions: Ordered, Application, Adjustment   Post Interventions Patient Tolerated: Well Instructions Provided: Adjustment of device  Jose Clay December 12/04/2023, 8:10 PM
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
## 2023-12-04 NOTE — Discharge Instructions (Signed)
 No signs of internal damage today.  You are can to be very sore over the next few days and can take Tylenol  and ibuprofen  as needed for pain.  If you start having severe abdominal pain, vomiting return to the emergency room.  Wear the Velcro wrist splint as long as you need if your wrist is hurting.
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
## 2023-12-19 DIAGNOSIS — G4733 Obstructive sleep apnea (adult) (pediatric): Secondary | ICD-10-CM | POA: Diagnosis not present

## 2023-12-21 DIAGNOSIS — Z23 Encounter for immunization: Secondary | ICD-10-CM | POA: Diagnosis not present

## 2023-12-21 DIAGNOSIS — I10 Essential (primary) hypertension: Secondary | ICD-10-CM | POA: Diagnosis not present

## 2023-12-21 DIAGNOSIS — Z9181 History of falling: Secondary | ICD-10-CM | POA: Diagnosis not present

## 2023-12-21 DIAGNOSIS — S46212A Strain of muscle, fascia and tendon of other parts of biceps, left arm, initial encounter: Secondary | ICD-10-CM | POA: Diagnosis not present

## 2023-12-21 DIAGNOSIS — I2510 Atherosclerotic heart disease of native coronary artery without angina pectoris: Secondary | ICD-10-CM | POA: Diagnosis not present

## 2023-12-21 DIAGNOSIS — M25562 Pain in left knee: Secondary | ICD-10-CM | POA: Diagnosis not present

## 2024-01-04 DIAGNOSIS — K080 Exfoliation of teeth due to systemic causes: Secondary | ICD-10-CM | POA: Diagnosis not present
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
## 2024-02-20 ENCOUNTER — Encounter: Payer: Self-pay | Admitting: Cardiology

## 2024-02-20 ENCOUNTER — Ambulatory Visit: Attending: Cardiology | Admitting: Cardiology

## 2024-02-20 VITALS — BP 138/80 | HR 60 | Ht 71.0 in | Wt 252.0 lb

## 2024-02-20 DIAGNOSIS — R931 Abnormal findings on diagnostic imaging of heart and coronary circulation: Secondary | ICD-10-CM | POA: Diagnosis not present

## 2024-02-20 DIAGNOSIS — E782 Mixed hyperlipidemia: Secondary | ICD-10-CM | POA: Diagnosis not present

## 2024-02-20 DIAGNOSIS — I2510 Atherosclerotic heart disease of native coronary artery without angina pectoris: Secondary | ICD-10-CM | POA: Diagnosis not present

## 2024-02-20 DIAGNOSIS — I119 Hypertensive heart disease without heart failure: Secondary | ICD-10-CM | POA: Diagnosis not present

## 2024-02-20 NOTE — Patient Instructions (Signed)
 Medication Instructions:  Your physician recommends that you continue on your current medications as directed. Please refer to the Current Medication list given to you today.  *If you need a refill on your cardiac medications before your next appointment, please call your pharmacy*  Lab Work: Your physician recommends that you return for lab work in:   Labs today: CMP, Lipids  If you have labs (blood work) drawn today and your tests are completely normal, you will receive your results only by: MyChart Message (if you have MyChart) OR A paper copy in the mail If you have any lab test that is abnormal or we need to change your treatment, we will call you to review the results.  Testing/Procedures: None  Follow-Up: At [facility], you and your health needs are our priority.  As part of our continuing mission to provide you with exceptional heart care, our providers are all part of one team.  This team includes your primary Cardiologist (physician) and Advanced Practice Providers or APPs (Physician Assistants and Nurse Practitioners) who all work together to provide you with the care you need, when you need it.  Your next appointment:   1 year(s)  Provider:   Redell Leiter, MD    We recommend signing up for the patient portal called MyChart.  Sign up information is provided on this After Visit Summary.  MyChart is used to connect with patients for Virtual Visits (Telemedicine).  Patients are able to view lab/test results, encounter notes, upcoming appointments, etc.  Non-urgent messages can be sent to your provider as well.   To learn more about what you can do with MyChart, go to [email or web address].   Other Instructions None

## 2024-02-21 ENCOUNTER — Encounter: Payer: Self-pay | Admitting: Cardiology

## 2024-02-21 LAB — COMPREHENSIVE METABOLIC PANEL WITH GFR
ALT: 38 IU/L (ref 0–44)
AST: 39 IU/L (ref 0–40)
Albumin: 4.5 g/dL (ref 3.9–4.9)
Alkaline Phosphatase: 84 IU/L (ref 47–123)
BUN/Creatinine Ratio: 11 (ref 10–24)
BUN: 11 mg/dL (ref 8–27)
Bilirubin Total: 0.6 mg/dL (ref 0.0–1.2)
CO2: 20 mmol/L (ref 20–29)
Calcium: 9.5 mg/dL (ref 8.6–10.2)
Chloride: 102 mmol/L (ref 96–106)
Creatinine, Ser: 0.99 mg/dL (ref 0.76–1.27)
Globulin, Total: 2.5 g/dL (ref 1.5–4.5)
Glucose: 83 mg/dL (ref 70–99)
Potassium: 4.1 mmol/L (ref 3.5–5.2)
Sodium: 140 mmol/L (ref 134–144)
Total Protein: 7 g/dL (ref 6.0–8.5)
eGFR: 83 mL/min/1.73

## 2024-02-21 LAB — LIPID PANEL
Chol/HDL Ratio: 2.1 ratio (ref 0.0–5.0)
Cholesterol, Total: 106 mg/dL (ref 100–199)
HDL: 50 mg/dL
LDL Chol Calc (NIH): 40 mg/dL (ref 0–99)
Triglycerides: 78 mg/dL (ref 0–149)
VLDL Cholesterol Cal: 16 mg/dL (ref 5–40)

## 2024-03-02 ENCOUNTER — Other Ambulatory Visit: Payer: Self-pay | Admitting: Cardiology

## 2024-03-02 DIAGNOSIS — I2510 Atherosclerotic heart disease of native coronary artery without angina pectoris: Secondary | ICD-10-CM
